# Patient Record
Sex: Female | Born: 1950 | Race: White | Hispanic: No | Marital: Married | Smoking: Never smoker
Health system: Southern US, Community
[De-identification: ages and names within clinical notes are randomized; demographics above are authoritative.]

## PROBLEM LIST (undated history)

## (undated) DIAGNOSIS — K219 Gastro-esophageal reflux disease without esophagitis: Secondary | ICD-10-CM

## (undated) DIAGNOSIS — E0590 Thyrotoxicosis, unspecified without thyrotoxic crisis or storm: Secondary | ICD-10-CM

## (undated) DIAGNOSIS — I10 Essential (primary) hypertension: Secondary | ICD-10-CM

## (undated) DIAGNOSIS — K2270 Barrett's esophagus without dysplasia: Secondary | ICD-10-CM

## (undated) DIAGNOSIS — M1990 Unspecified osteoarthritis, unspecified site: Secondary | ICD-10-CM

## (undated) DIAGNOSIS — E119 Type 2 diabetes mellitus without complications: Secondary | ICD-10-CM

## (undated) DIAGNOSIS — E7800 Pure hypercholesterolemia, unspecified: Secondary | ICD-10-CM

## (undated) DIAGNOSIS — R112 Nausea with vomiting, unspecified: Secondary | ICD-10-CM

## (undated) DIAGNOSIS — G4730 Sleep apnea, unspecified: Secondary | ICD-10-CM

## (undated) DIAGNOSIS — Z9889 Other specified postprocedural states: Secondary | ICD-10-CM

## (undated) HISTORY — DX: Barrett's esophagus without dysplasia: K22.70

## (undated) HISTORY — DX: Pure hypercholesterolemia, unspecified: E78.00

## (undated) HISTORY — DX: Type 2 diabetes mellitus without complications: E11.9

## (undated) HISTORY — DX: Essential (primary) hypertension: I10

## (undated) HISTORY — PX: CHOLECYSTECTOMY: SHX55

## (undated) HISTORY — DX: Thyrotoxicosis, unspecified without thyrotoxic crisis or storm: E05.90

## (undated) HISTORY — PX: PARTIAL HYSTERECTOMY: SHX80

## (undated) HISTORY — PX: CATARACT EXTRACTION: SUR2

---

## 2017-03-15 ENCOUNTER — Encounter (INDEPENDENT_AMBULATORY_CARE_PROVIDER_SITE_OTHER): Payer: Self-pay | Admitting: Internal Medicine

## 2017-03-15 ENCOUNTER — Encounter (INDEPENDENT_AMBULATORY_CARE_PROVIDER_SITE_OTHER): Payer: Self-pay

## 2017-03-29 ENCOUNTER — Encounter (INDEPENDENT_AMBULATORY_CARE_PROVIDER_SITE_OTHER): Payer: Self-pay | Admitting: *Deleted

## 2017-03-29 ENCOUNTER — Encounter (INDEPENDENT_AMBULATORY_CARE_PROVIDER_SITE_OTHER): Payer: Self-pay | Admitting: Internal Medicine

## 2017-03-29 ENCOUNTER — Ambulatory Visit (INDEPENDENT_AMBULATORY_CARE_PROVIDER_SITE_OTHER): Payer: Medicare Other | Admitting: Internal Medicine

## 2017-03-29 ENCOUNTER — Other Ambulatory Visit (INDEPENDENT_AMBULATORY_CARE_PROVIDER_SITE_OTHER): Payer: Self-pay | Admitting: Internal Medicine

## 2017-03-29 VITALS — BP 126/90 | HR 60 | Temp 98.3°F | Ht 63.0 in | Wt 166.6 lb

## 2017-03-29 DIAGNOSIS — I10 Essential (primary) hypertension: Secondary | ICD-10-CM | POA: Diagnosis not present

## 2017-03-29 DIAGNOSIS — K2270 Barrett's esophagus without dysplasia: Secondary | ICD-10-CM

## 2017-03-29 DIAGNOSIS — E0590 Thyrotoxicosis, unspecified without thyrotoxic crisis or storm: Secondary | ICD-10-CM

## 2017-03-29 DIAGNOSIS — E7800 Pure hypercholesterolemia, unspecified: Secondary | ICD-10-CM | POA: Diagnosis not present

## 2017-03-29 DIAGNOSIS — E119 Type 2 diabetes mellitus without complications: Secondary | ICD-10-CM

## 2017-03-29 HISTORY — DX: Type 2 diabetes mellitus without complications: E11.9

## 2017-03-29 HISTORY — DX: Thyrotoxicosis, unspecified without thyrotoxic crisis or storm: E05.90

## 2017-03-29 HISTORY — DX: Pure hypercholesterolemia, unspecified: E78.00

## 2017-03-29 HISTORY — DX: Barrett's esophagus without dysplasia: K22.70

## 2017-03-29 HISTORY — DX: Essential (primary) hypertension: I10

## 2017-03-29 NOTE — Patient Instructions (Addendum)
EGD in December.  The risks of bleeding, perforation and infection were reviewed with patient.

## 2017-03-29 NOTE — Progress Notes (Signed)
   Subjective:    Patient ID: Renee Acevedo, female    DOB: 03/04/1951, 66 y.o.   MRN: [id number]  HPI  Referred by Blima DessertJo Ann Earp FNP  for Barrett's esophagus. Diagnosed with 2011. Appetite is good. No weight loss. Acid reflux controlled with Omeprazole. Has a BM daily.   Hx of throat cancer father (father was an alcoholic).  09/08/2010 EGD: Dr. Aleene DavidsonSpainhour. Small hiatus hernia with distal erosions and questionable  Barrett's. Biopsy: Barrett's esophagus.    03/10/2012 Colonoscopy: External hemorrhoids (screening) F/u 10 yrs.  Review of Systems Past Medical History:  Diagnosis Date  . Barrett's esophagus 03/29/2017  . Diabetes (HCC) 03/29/2017  . Essential hypertension, benign 03/29/2017  . High cholesterol 03/29/2017  . High cholesterol 03/29/2017  . Hyperthyroidism 03/29/2017  . Hyperthyroidism 03/29/2017    No past surgical history on file.  Allergies  Allergen Reactions  . Avelox [Moxifloxacin Hcl In Nacl]     Burning her skin. blisters    No current outpatient prescriptions on file prior to visit.   No current facility-administered medications on file prior to visit.    Current Outpatient Prescriptions  Medication Sig Dispense Refill  . alendronate (FOSAMAX) 70 MG tablet Take 70 mg by mouth once a week. Take with a full glass of water on an empty stomach.    . Ascorbic Acid (VITAMIN C) 500 MG CAPS Take by mouth.    Marland Kitchen. aspirin EC 81 MG tablet Take 81 mg by mouth daily.    Marland Kitchen. atorvastatin (LIPITOR) 20 MG tablet Take 20 mg by mouth daily.    . calcium-vitamin D (OSCAL WITH D) 500-200 MG-UNIT tablet Take 1 tablet by mouth.    . Cholecalciferol (VITAMIN D3) 2000 units TABS Take 5,000 Units by mouth.     . Cinnamon 500 MG TABS Take by mouth.    . doxycycline (VIBRAMYCIN) 50 MG capsule Take 50 mg by mouth daily.     Marland Kitchen. levothyroxine (SYNTHROID, LEVOTHROID) 50 MCG tablet Take 50 mcg by mouth daily before breakfast. Take 50mcg one day and 75mcg the next day.    . lisinopril  (PRINIVIL,ZESTRIL) 20 MG tablet Take 20 mg by mouth daily.    . magnesium 30 MG tablet Take 250 mg by mouth daily.    . Omega-3 Fatty Acids (FISH OIL) 1000 MG CPDR Take 1,600 mg by mouth. Takes 3 tabs a night    . omeprazole (PRILOSEC) 20 MG capsule Take 20 mg by mouth daily.     No current facility-administered medications for this visit.          Objective:   Physical Exam Blood pressure 126/90, pulse 60, temperature 98.3 F (36.8 C), height 5\' 3"  (1.6 m), weight 166 lb 9.6 oz (75.6 kg). Alert and oriented. Skin warm and dry. Oral mucosa is moist.   . Sclera anicteric, conjunctivae is pink. Thyroid not enlarged. No cervical lymphadenopathy. Lungs clear. Heart regular rate and rhythm.  Abdomen is soft. Bowel sounds are positive. No hepatomegaly. No abdominal masses felt. No tenderness.  No edema to lower extremities.           Assessment & Plan:  Barrett's esophagus. I discussed with Dr. Karilyn Cotaehman. EGD. She will also need EGD every 5 yrs.

## 2017-04-01 ENCOUNTER — Encounter (INDEPENDENT_AMBULATORY_CARE_PROVIDER_SITE_OTHER): Payer: Self-pay

## 2017-07-13 ENCOUNTER — Encounter (HOSPITAL_COMMUNITY)
Admission: RE | Disposition: A | Discharge status: Home / Self Care | Payer: Self-pay | Source: Ambulatory Visit | Attending: Internal Medicine

## 2017-07-13 ENCOUNTER — Ambulatory Visit (HOSPITAL_COMMUNITY)
Admission: RE | Admit: 2017-07-13 | Discharge: 2017-07-13 | Disposition: A | Discharge status: Home / Self Care | Payer: Medicare Other | Source: Ambulatory Visit | Attending: Internal Medicine | Admitting: Internal Medicine

## 2017-07-13 ENCOUNTER — Encounter (HOSPITAL_COMMUNITY): Payer: Self-pay

## 2017-07-13 DIAGNOSIS — Z7982 Long term (current) use of aspirin: Secondary | ICD-10-CM | POA: Insufficient documentation

## 2017-07-13 DIAGNOSIS — I10 Essential (primary) hypertension: Secondary | ICD-10-CM | POA: Diagnosis not present

## 2017-07-13 DIAGNOSIS — E119 Type 2 diabetes mellitus without complications: Secondary | ICD-10-CM | POA: Diagnosis not present

## 2017-07-13 DIAGNOSIS — E7800 Pure hypercholesterolemia, unspecified: Secondary | ICD-10-CM | POA: Insufficient documentation

## 2017-07-13 DIAGNOSIS — K2270 Barrett's esophagus without dysplasia: Secondary | ICD-10-CM | POA: Insufficient documentation

## 2017-07-13 DIAGNOSIS — E0590 Thyrotoxicosis, unspecified without thyrotoxic crisis or storm: Secondary | ICD-10-CM | POA: Insufficient documentation

## 2017-07-13 DIAGNOSIS — Z79899 Other long term (current) drug therapy: Secondary | ICD-10-CM | POA: Diagnosis not present

## 2017-07-13 DIAGNOSIS — K317 Polyp of stomach and duodenum: Secondary | ICD-10-CM | POA: Insufficient documentation

## 2017-07-13 DIAGNOSIS — Z888 Allergy status to other drugs, medicaments and biological substances status: Secondary | ICD-10-CM | POA: Diagnosis not present

## 2017-07-13 DIAGNOSIS — K219 Gastro-esophageal reflux disease without esophagitis: Secondary | ICD-10-CM | POA: Diagnosis not present

## 2017-07-13 DIAGNOSIS — Z885 Allergy status to narcotic agent status: Secondary | ICD-10-CM | POA: Diagnosis not present

## 2017-07-13 DIAGNOSIS — K449 Diaphragmatic hernia without obstruction or gangrene: Secondary | ICD-10-CM | POA: Insufficient documentation

## 2017-07-13 DIAGNOSIS — K228 Other specified diseases of esophagus: Secondary | ICD-10-CM

## 2017-07-13 HISTORY — DX: Other specified postprocedural states: Z98.890

## 2017-07-13 HISTORY — PX: BIOPSY: SHX5522

## 2017-07-13 HISTORY — PX: ESOPHAGOGASTRODUODENOSCOPY: SHX5428

## 2017-07-13 HISTORY — DX: Nausea with vomiting, unspecified: R11.2

## 2017-07-13 LAB — GLUCOSE, CAPILLARY: Glucose-Capillary: 129 mg/dL — ABNORMAL HIGH (ref 65–99)

## 2017-07-13 SURGERY — EGD (ESOPHAGOGASTRODUODENOSCOPY)
Anesthesia: Moderate Sedation

## 2017-07-13 MED ORDER — LIDOCAINE VISCOUS 2 % MT SOLN
OROMUCOSAL | Status: AC
  Filled 2017-07-13: qty 15

## 2017-07-13 MED ORDER — STERILE WATER FOR IRRIGATION IR SOLN
Status: DC | PRN
  Administered 2017-07-13: 10:00:00

## 2017-07-13 MED ORDER — MEPERIDINE HCL 50 MG/ML IJ SOLN
INTRAMUSCULAR | Status: AC
  Filled 2017-07-13: qty 1

## 2017-07-13 MED ORDER — MIDAZOLAM HCL 5 MG/5ML IJ SOLN
INTRAMUSCULAR | Status: DC | PRN
  Administered 2017-07-13 (×2): 2 mg via INTRAVENOUS

## 2017-07-13 MED ORDER — SODIUM CHLORIDE 0.9 % IV SOLN
INTRAVENOUS | Status: DC
  Administered 2017-07-13: 1000 mL via INTRAVENOUS

## 2017-07-13 MED ORDER — LIDOCAINE VISCOUS 2 % MT SOLN
OROMUCOSAL | Status: DC | PRN
  Administered 2017-07-13: 4 mL via OROMUCOSAL

## 2017-07-13 MED ORDER — MIDAZOLAM HCL 5 MG/5ML IJ SOLN
INTRAMUSCULAR | Status: AC
  Filled 2017-07-13: qty 10

## 2017-07-13 MED ORDER — MEPERIDINE HCL 50 MG/ML IJ SOLN
INTRAMUSCULAR | Status: DC | PRN
  Administered 2017-07-13 (×2): 25 mg via INTRAVENOUS

## 2017-07-13 NOTE — Discharge Instructions (Signed)
Resume aspirin on 07/14/2017. Resume other medications and diet as before. No driving for 24 hours. Physician will call with biopsy results.     Esophagogastroduodenoscopy, Care After Refer to this sheet in the next few weeks. These instructions provide you with information about caring for yourself after your procedure. Your health care provider may also give you more specific instructions. Your treatment has been planned according to current medical practices, but problems sometimes occur. Call your health care provider if you have any problems or questions after your procedure. What can I expect after the procedure? After the procedure, it is common to have:  A sore throat.  Nausea.  Bloating.  Dizziness.  Fatigue.  Follow these instructions at home:  Do not eat or drink anything until the numbing medicine (local anesthetic) has worn off and your gag reflex has returned. You will know that the local anesthetic has worn off when you can swallow comfortably.  Do not drive for 24 hours if you received a medicine to help you relax (sedative).  If your health care provider took a tissue sample for testing during the procedure, make sure to get your test results. This is your responsibility. Ask your health care provider or the department performing the test when your results will be ready.  Keep all follow-up visits as told by your health care provider. This is important. Contact a health care provider if:  You cannot stop coughing.  You are not urinating.  You are urinating less than usual. Get help right away if:  You have trouble swallowing.  You cannot eat or drink.  You have throat or chest pain that gets worse.  You are dizzy or light-headed.  You faint.  You have nausea or vomiting.  You have chills.  You have a fever.  You have severe abdominal pain.  You have black, tarry, or bloody stools. This information is not intended to replace advice given to  you by your health care provider. Make sure you discuss any questions you have with your health care provider. Document Released: 08/16/2012 Document Revised: 02/05/2016 Document Reviewed: 07/24/2015 Elsevier Interactive Patient Education  2018 [employer]     Hiatal Hernia A hiatal hernia occurs when part of the stomach slides above the muscle that separates the abdomen from the chest (diaphragm). A person can be born with a hiatal hernia (congenital), or it may develop over time. In almost all cases of hiatal hernia, only the top part of the stomach pushes through the diaphragm. Many people have a hiatal hernia with no symptoms. The larger the hernia, the more likely it is that you will have symptoms. In some cases, a hiatal hernia allows stomach acid to flow back into the tube that carries food from your mouth to your stomach (esophagus). This may cause heartburn symptoms. Severe heartburn symptoms may mean that you have developed a condition called gastroesophageal reflux disease (GERD). What are the causes? This condition is caused by a weakness in the opening (hiatus) where the esophagus passes through the diaphragm to attach to the upper part of the stomach. A person may be born with a weakness in the hiatus, or a weakness can develop over time. What increases the risk? This condition is more likely to develop in:  Older people. Age is a major risk factor for a hiatal hernia, especially if you are over the age of 40.  Pregnant women.  People who are overweight.  People who have frequent constipation.  What are  the signs or symptoms? Symptoms of this condition usually develop in the form of GERD symptoms. Symptoms include:  Heartburn.  Belching.  Indigestion.  Trouble swallowing.  Coughing or wheezing.  Sore throat.  Hoarseness.  Chest pain.  Nausea and vomiting.  How is this diagnosed? This condition may be diagnosed during testing for GERD. Tests that may be  done include:  X-rays of your stomach or chest.  An upper gastrointestinal (GI) series. This is an X-ray exam of your GI tract that is taken after you swallow a chalky liquid that shows up clearly on the X-ray.  Endoscopy. This is a procedure to look into your stomach using a thin, flexible tube that has a tiny camera and light on the end of it.  How is this treated? This condition may be treated by:  Dietary and lifestyle changes to help reduce GERD symptoms.  Medicines. These may include: ? Over-the-counter antacids. ? Medicines that make your stomach empty more quickly. ? Medicines that block the production of stomach acid (H2 blockers). ? Stronger medicines to reduce stomach acid (proton pump inhibitors).  Surgery to repair the hernia, if other treatments are not helping.  If you have no symptoms, you may not need treatment. Follow these instructions at home: Lifestyle and activity  Do not use any products that contain nicotine or tobacco, such as cigarettes and e-cigarettes. If you need help quitting, ask your health care provider.  Try to achieve and maintain a healthy body weight.  Avoid putting pressure on your abdomen. Anything that puts pressure on your abdomen increases the amount of acid that may be pushed up into your esophagus. ? Avoid bending over, especially after eating. ? Raise the head of your bed by putting blocks under the legs. This keeps your head and esophagus higher than your stomach. ? Do not wear tight clothing around your chest or stomach. ? Try not to strain when having a bowel movement, when urinating, or when lifting heavy objects. Eating and drinking  Avoid foods that can worsen GERD symptoms. These may include: ? Fatty foods, like fried foods. ? Citrus fruits, like oranges or lemon. ? Other foods and drinks that contain acid, like orange juice or tomatoes. ? Spicy food. ? Chocolate.  Eat frequent small meals instead of three large meals a  day. This helps prevent your stomach from getting too full. ? Eat slowly. ? Do not lie down right after eating. ? Do not eat 1-2 hours before bed.  Do not drink beverages with caffeine. These include cola, coffee, cocoa, and tea.  Do not drink alcohol. General instructions  Take over-the-counter and prescription medicines only as told by your health care provider.  Keep all follow-up visits as told by your health care provider. This is important. Contact a health care provider if:  Your symptoms are not controlled with medicines or lifestyle changes.  You are having trouble swallowing.  You have coughing or wheezing that will not go away. Get help right away if:  Your pain is getting worse.  Your pain spreads to your arms, neck, jaw, teeth, or back.  You have shortness of breath.  You sweat for no reason.  You feel sick to your stomach (nauseous) or you vomit.  You vomit blood.  You have bright red blood in your stools.  You have black, tarry stools. This information is not intended to replace advice given to you by your health care provider. Make sure you discuss any questions you have  with your health care provider. Document Released: 11/20/2003 Document Revised: 08/23/2016 Document Reviewed: 08/23/2016 Elsevier Interactive Patient Education  [employer].

## 2017-07-13 NOTE — Op Note (Signed)
[facility] Patient Name: Renee Acevedo Procedure Date: 07/13/2017 9:20 AM MRN: [id number] Date of Birth: Oct 16, 1950 Attending MD: Lionel December , MD CSN: [id number] Age: 66 Admit Type: Outpatient Procedure:                Upper GI endoscopy Indications:              Barrett's esophagus, Follow-up of Barrett's                            esophagus Providers:                Lionel December, MD, Loma Messing B. Patsy Lager, RN, Edrick Kins, RN Referring MD:             Rande Brunt. Pomposini, MD Medicines:                Lidocaine spray, Meperidine 50 mg IV, Midazolam 4                            mg IV Complications:            No immediate complications. Estimated Blood Loss:     Estimated blood loss was minimal. Procedure:                Pre-Anesthesia Assessment:                           - Prior to the procedure, a History and Physical                            was performed, and patient medications and                            allergies were reviewed. The patient's tolerance of                            previous anesthesia was also reviewed. The risks                            and benefits of the procedure and the sedation                            options and risks were discussed with the patient.                            All questions were answered, and informed consent                            was obtained. Prior Anticoagulants: The patient                            last took aspirin 5 days prior to the procedure.  ASA Grade Assessment: II - A patient with mild                            systemic disease. After reviewing the risks and                            benefits, the patient was deemed in satisfactory                            condition to undergo the procedure.                           After obtaining informed consent, the endoscope was                            passed under direct vision. Throughout the                         procedure, the patient's blood pressure, pulse, and                            oxygen saturations were monitored continuously. The                            [id number]-[id number] ([id number]) scope was introduced through the                            mouth, and advanced to the second part of duodenum.                            The upper GI endoscopy was accomplished without                            difficulty. The patient tolerated the procedure                            well. Scope In: 9:39:13 AM Scope Out: 9:50:18 AM Total Procedure Duration: 0 hours 11 minutes 5 seconds  Findings:      The proximal esophagus and mid esophagus were normal.      There were esophageal mucosal changes consistent with short-segment       Barrett's esophagus present in the distal esophagus. The maximum       longitudinal extent of these mucosal changes was 0.8 cm in length.       Mucosa was biopsied with a cold forceps for histology randomly proximal       to the GE junction.      The Z-line was irregular and was found 34 cm from the incisors.      A 2 cm hiatal hernia was present.      Two small sessile polyps were found in the gastric fundus.      The exam of the stomach was otherwise normal.      The duodenal bulb and second portion of the duodenum were normal. Impression:               - Normal proximal esophagus  and mid esophagus.                           - Esophageal mucosal changes consistent with                            short-segment Barrett's esophagus. Single small                            patch. Biopsied.                           - Z-line irregular, 34 cm from the incisors.                           - 2 cm hiatal hernia.                           - Two gastric polyps.                           - Normal duodenal bulb and second portion of the                            duodenum. Moderate Sedation:      Moderate (conscious) sedation was administered by the endoscopy nurse        and supervised by the endoscopist. The following parameters were       monitored: oxygen saturation, heart rate, blood pressure, CO2       capnography and response to care. Total physician intraservice time was       16 minutes. Recommendation:           - Patient has a contact number available for                            emergencies. The signs and symptoms of potential                            delayed complications were discussed with the                            patient. Return to normal activities tomorrow.                            Written discharge instructions were provided to the                            patient.                           - Resume previous diet today.                           - Continue present medications.                           - Resume aspirin at prior dose tomorrow.                           -  Await pathology results.                           - Repeat upper endoscopy in 5 years for                            surveillance. Procedure Code(s):        --- Professional ---                           [phone], Esophagogastroduodenoscopy, flexible,                            transoral; with biopsy, single or multiple                           99152, Moderate sedation services provided by the                            same physician or other qualified health care                            professional performing the diagnostic or                            therapeutic service that the sedation supports,                            requiring the presence of an independent trained                            observer to assist in the monitoring of the                            patient's level of consciousness and physiological                            status; initial 15 minutes of intraservice time,                            patient age 23 years or older Diagnosis Code(s):        --- Professional ---                           K22.70, Barrett's esophagus  without dysplasia                           K22.8, Other specified diseases of esophagus                           K44.9, Diaphragmatic hernia without obstruction or                            gangrene  K31.7, Polyp of stomach and duodenum CPT copyright 2016 American Medical Association. All rights reserved. The codes documented in this report are preliminary and upon coder review may  be revised to meet current compliance requirements. Lionel DecemberNajeeb Parks Czajkowski, MD Lionel DecemberNajeeb Jibran Crookshanks, MD 07/13/2017 10:03:52 AM This report has been signed electronically. Number of Addenda: 0

## 2017-07-13 NOTE — H&P (Addendum)
Renee Acevedo is an 66 y.o. female.   Chief Complaint: Patient is here for EGD. HPI: Patient is 66 year old Caucasian female who was chronic GERD complicated by Barrett's esophagus who is here for surveillance EGD.  She denies dysphagia nausea vomiting epigastric pain or melena.  She feels heartburn is well controlled with medication and dietary measures.  Last EGD with biopsy was 3 years ago.  Past Medical History:  Diagnosis Date  . Barrett's esophagus 03/29/2017  . Diabetes (HCC) 03/29/2017  . Essential hypertension, benign 03/29/2017  . High cholesterol 03/29/2017   Rosacea 03/29/2017    03/29/2017  . Hyperthyroidism 03/29/2017  . PONV (postoperative nausea and vomiting)     Past Surgical History:  Procedure Laterality Date  . CATARACT EXTRACTION     both eyes  . CHOLECYSTECTOMY    . PARTIAL HYSTERECTOMY      History reviewed. No pertinent family history. Social History:  reports that she has never smoked. She has never used smokeless tobacco. She reports that she does not drink alcohol or use drugs.  Allergies:  Allergies  Allergen Reactions  . Avelox [Moxifloxacin Hcl In Nacl]     Burning her skin. blisters  . Codeine Nausea And Vomiting    Cough syrup     Medications Prior to Admission  Medication Sig Dispense Refill  . acetaminophen (TYLENOL) 500 MG tablet Take 1,000 mg by mouth daily as needed for moderate pain or headache.    Marland Kitchen aspirin EC 81 MG tablet Take 81 mg by mouth every other day.     Marland Kitchen atorvastatin (LIPITOR) 40 MG tablet Take 40 mg by mouth daily.    . Azelaic Acid (FINACEA) 15 % cream Apply 1 application topically 2 (two) times daily. To face    . calcium-vitamin D (OSCAL WITH D) 500-200 MG-UNIT tablet Take 1 tablet by mouth every other day.     . Cholecalciferol (VITAMIN D3) 5000 units CAPS Take 5,000 Units by mouth daily.    Marland Kitchen CINNAMON PO Take 1,000 mg by mouth daily.    Marland Kitchen doxycycline (VIBRAMYCIN) 50 MG capsule Take 50 mg by mouth daily.     Marland Kitchen  levothyroxine (SYNTHROID, LEVOTHROID) 50 MCG tablet Take 50 mcg by mouth every other day. Alternate with every other day    . levothyroxine (SYNTHROID, LEVOTHROID) 75 MCG tablet Take 75 mcg by mouth every other day. Alternate with every other day    . lisinopril (PRINIVIL,ZESTRIL) 20 MG tablet Take 20 mg by mouth daily.    . Magnesium 250 MG TABS Take 250 mg by mouth daily.    . Omega-3-6-9 CAPS Take 3 capsules by mouth daily.    Marland Kitchen omeprazole (PRILOSEC) 20 MG capsule Take 20 mg by mouth daily.    Marland Kitchen Propylene Glycol (SYSTANE BALANCE OP) Apply 1 drop to eye daily as needed (dry eyes).    . vitamin C (ASCORBIC ACID) 500 MG tablet Take 500 mg by mouth daily.      Results for orders placed or performed during the hospital encounter of 07/13/17 (from the past 48 hour(s))  Glucose, capillary     Status: Abnormal   Collection Time: 07/13/17  9:08 AM  Result Value Ref Range   Glucose-Capillary 129 (H) 65 - 99 mg/dL   No results found.  ROS  Blood pressure (!) 126/59, pulse 64, temperature 98 F (36.7 C), temperature source Oral, resp. rate 14, height 5' 1.5" (1.562 m), weight 163 lb (73.9 kg), SpO2 100 %. Physical Exam  Constitutional: She appears well-developed and well-nourished.  HENT:  Mouth/Throat: Oropharynx is clear and moist.  Eyes: Conjunctivae are normal. No scleral icterus.  Neck: No thyromegaly present.  Cardiovascular: Normal rate, regular rhythm and normal heart sounds.   No murmur heard. Respiratory: Effort normal and breath sounds normal.  GI: Soft. She exhibits no distension and no mass. There is no tenderness.  Musculoskeletal: She exhibits no edema.  Lymphadenopathy:    She has no cervical adenopathy.  Neurological: She is alert.  Skin: Skin is warm and dry.     Assessment/Plan Chronic GERD complicated by Barrett's esophagus.. Surveillance EGD.  Lionel DecemberNajeeb Laasia Arcos, MD 07/13/2017, 9:30 AM

## 2017-07-18 ENCOUNTER — Encounter (HOSPITAL_COMMUNITY): Payer: Self-pay | Admitting: Internal Medicine

## 2021-02-03 ENCOUNTER — Encounter (INDEPENDENT_AMBULATORY_CARE_PROVIDER_SITE_OTHER): Payer: Self-pay | Admitting: *Deleted

## 2021-02-04 ENCOUNTER — Telehealth (INDEPENDENT_AMBULATORY_CARE_PROVIDER_SITE_OTHER): Payer: Medicare Other | Admitting: Gastroenterology

## 2021-02-04 ENCOUNTER — Encounter (INDEPENDENT_AMBULATORY_CARE_PROVIDER_SITE_OTHER): Payer: Self-pay | Admitting: Gastroenterology

## 2021-02-04 ENCOUNTER — Other Ambulatory Visit: Payer: Self-pay

## 2021-02-04 DIAGNOSIS — R633 Feeding difficulties, unspecified: Secondary | ICD-10-CM

## 2021-02-04 DIAGNOSIS — R1312 Dysphagia, oropharyngeal phase: Secondary | ICD-10-CM | POA: Diagnosis not present

## 2021-02-04 DIAGNOSIS — T17308A Unspecified foreign body in larynx causing other injury, initial encounter: Secondary | ICD-10-CM | POA: Insufficient documentation

## 2021-02-04 DIAGNOSIS — R1310 Dysphagia, unspecified: Secondary | ICD-10-CM | POA: Insufficient documentation

## 2021-02-04 NOTE — Progress Notes (Signed)
Katrinka Blazing, M.D. Gastroenterology & Hepatology [facility] [address] [place], [place] [address] Primary Care Physician: Pomposini, Rande Brunt, MD No address on file  Referring MD: PCP  This is a telephone virtual visit.  It required patient-provider interaction for the medical decision making as documented below. The patient has consented and agreed to proceed with a Telehealth encounter given the current Coronavirus pandemic.  VIRTUAL VISIT NOTE Patient location: Home Provider location: Home  I will communicate my assessment and recommendations to the referring MD via EMR.  Chief Complaint: Choking  History of Present Illness: Renee Acevedo is a 70 y.o. 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  The patient reports that sometimes when she starts drinking liquids she presents some choking episodes.  She states that these symptoms started 3-5 months ago.  She denies having any dysphagia to solids and states that she has been able to eat as usual otherwise.  Denies having any regurgitation of food after eating.  States that prior to this she did not have any problems with liquids in the past. The patient denies having any odynophagia, heartburn, nausea, vomiting, fever, chills, hematochezia, melena, hematemesis, abdominal distention, abdominal pain, diarrhea, jaundice, pruritus. Lost 30 lb after implementing [employer] changes.  Patient takes omeprazole 20 mg at nighttime for GERD and Barrett's esophagus.  She was advised to have a repeat EGD 5 years after her most recent examination for surveillance of Barrett's esophagus.  Last EGD: 2018 - Normal proximal esophagus and mid esophagus. - Esophageal mucosal changes consistent with short-segment Barrett's esophagus. Single small patch. 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 - 2 cm hiatal hernia. - Two gastric polyps. - Normal duodenal bulb and second portion of the duodenum. Recommended to repeat EGD in 5 years.  Last Colonoscopy:2013 - normal per patient, no report available  FHx: neg for any gastrointestinal/liver disease, mother kidney cancer, father throat cancer Social: neg smoking, alcohol or illicit drug use Surgical: cholecystectomy  Past Medical History: Past Medical History:  Diagnosis Date  . Barrett's esophagus 03/29/2017  . Diabetes (HCC) 03/29/2017  . Essential hypertension, benign 03/29/2017  . High cholesterol 03/29/2017  . High cholesterol 03/29/2017  . Hyperthyroidism 03/29/2017  . Hyperthyroidism 03/29/2017  . PONV (postoperative nausea and vomiting)     Past Surgical History: Past Surgical History:  Procedure Laterality Date  . BIOPSY  07/13/2017   Procedure: BIOPSY;  Surgeon: Malissa Hippo, MD;  [facility];  Service: Endoscopy;;  esophagus  . CATARACT EXTRACTION     both eyes  . CHOLECYSTECTOMY    . ESOPHAGOGASTRODUODENOSCOPY N/A 07/13/2017   Procedure: ESOPHAGOGASTRODUODENOSCOPY (EGD);  Surgeon: Malissa Hippo, MD;  [facility];  Service: Endoscopy;  Laterality: N/A;  12:00  . PARTIAL HYSTERECTOMY      Family History:History reviewed. No pertinent family history.  Social History: Social History   Tobacco Use  Smoking Status Never Smoker  Smokeless Tobacco Never Used   Social History   Substance and Sexual Activity  Alcohol Use No   Social History   Substance and Sexual Activity  Drug Use No    Allergies: Allergies  Allergen Reactions  . Avelox [Moxifloxacin Hcl In Nacl]     Burning her skin. blisters  . Codeine Nausea And Vomiting    Cough syrup     Medications: Current Outpatient Medications  Medication Sig Dispense Refill  . acetaminophen (TYLENOL) 500 MG tablet Take 1,000 mg by mouth daily as needed for moderate pain or headache.    Marland Kitchen aspirin EC 81 MG tablet  Take 1 tablet (81 mg total) by mouth every other day.    Marland Kitchen atorvastatin (LIPITOR) 40 MG tablet Take 40 mg by mouth daily.    . Azelaic Acid 15 % gel Apply 1 application topically 2 (two) times daily. To face    . calcium-vitamin D (OSCAL WITH D) 500-200 MG-UNIT tablet Take 1 tablet by mouth every other day.     . Cholecalciferol (VITAMIN D3) 5000 units CAPS Take 5,000 Units by mouth daily.    Marland Kitchen CINNAMON PO Take 1,000 mg by mouth daily.    Marland Kitchen doxycycline (VIBRAMYCIN) 50 MG capsule Take 50 mg by mouth daily.     Marland Kitchen ezetimibe (ZETIA) 10 MG tablet Take 10 mg by mouth daily.    Marland Kitchen levothyroxine (SYNTHROID, LEVOTHROID) 50 MCG tablet Take 50 mcg by mouth every other day. Alternate with every other day    . levothyroxine (SYNTHROID, LEVOTHROID) 75 MCG tablet Take 75 mcg by mouth every other day. Alternate with every other day    . lisinopril (PRINIVIL,ZESTRIL) 20 MG tablet Take 20 mg by mouth daily. 10 mg per day per patient.    . Magnesium 250 MG TABS Take 250 mg by mouth daily.    . Omega-3-6-9 CAPS Take 3 capsules by mouth daily.    Marland Kitchen omeprazole (PRILOSEC) 20 MG capsule Take 20 mg by mouth daily.    Marland Kitchen Propylene Glycol (SYSTANE BALANCE OP) Apply 1 drop to eye daily as needed (dry eyes).    . vitamin C (ASCORBIC ACID) 500 MG tablet Take 500 mg by mouth daily.     No current facility-administered medications for this visit.    Review of Systems: GENERAL: negative for malaise, night sweats HEENT: No changes in hearing or vision, no nose bleeds or other nasal problems. NECK: Negative for lumps, goiter, pain and significant neck swelling RESPIRATORY: Negative for cough, wheezing CARDIOVASCULAR: Negative for chest pain, leg swelling, palpitations, orthopnea GI: SEE HPI MUSCULOSKELETAL: Negative for joint pain or swelling, back pain, and muscle pain. SKIN: Negative for lesions, rash PSYCH: Negative for sleep disturbance, mood disorder and recent psychosocial stressors. HEMATOLOGY Negative  for prolonged bleeding, bruising easily, and swollen nodes. ENDOCRINE: Negative for cold or heat intolerance, polyuria, polydipsia and goiter. NEURO: negative for tremor, gait imbalance, syncope and seizures. The remainder of the review of systems is noncontributory.   Physical Exam: No exam was performed as this was a telephone encounter  Imaging/Labs: as above  I personally reviewed and interpreted the available labs, imaging and endoscopic files.  Impression and Plan: Renee Acevedo is a 70 y.o. 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  The patient has presented symptoms only with intake of liquids.  I suspect these symptoms are due to an oropharyngeal dysfunction as she does not present any dysphagia or any other red flag signs, and she had a relatively recent endoscopic investigation which was unremarkable for any esophageal source.  Due to this, we will proceed with a modified barium swallow.  I explained to the patient that if these investigations are negative we may need to proceed with a repeat EGD.  The patient understood and agreed.  Schedule modified barium swallow  All questions were answered.      Total  visit time: I spent a total of  30 minutes  Katrinka Blazing, MD Gastroenterology and Hepatology [facility]

## 2021-02-04 NOTE — Patient Instructions (Signed)
Schedule modified barium swallow If negative findings in MBS, will consider EGD

## 2021-02-10 ENCOUNTER — Other Ambulatory Visit (HOSPITAL_COMMUNITY): Payer: Self-pay | Admitting: Specialist

## 2021-02-10 DIAGNOSIS — R1312 Dysphagia, oropharyngeal phase: Secondary | ICD-10-CM

## 2021-02-10 DIAGNOSIS — R633 Feeding difficulties, unspecified: Secondary | ICD-10-CM

## 2021-02-13 ENCOUNTER — Other Ambulatory Visit (HOSPITAL_COMMUNITY): Payer: Self-pay | Admitting: Specialist

## 2021-02-13 DIAGNOSIS — K2270 Barrett's esophagus without dysplasia: Secondary | ICD-10-CM

## 2021-02-23 ENCOUNTER — Ambulatory Visit (HOSPITAL_COMMUNITY)
Admission: RE | Admit: 2021-02-23 | Discharge: 2021-02-23 | Disposition: A | Discharge status: Home / Self Care | Payer: Medicare Other | Source: Ambulatory Visit | Attending: Gastroenterology | Admitting: Gastroenterology

## 2021-02-23 ENCOUNTER — Ambulatory Visit (HOSPITAL_COMMUNITY): Payer: Medicare Other | Attending: Gastroenterology | Admitting: Speech Pathology

## 2021-02-23 ENCOUNTER — Other Ambulatory Visit: Payer: Self-pay

## 2021-02-23 ENCOUNTER — Encounter (HOSPITAL_COMMUNITY): Payer: Self-pay | Admitting: Speech Pathology

## 2021-02-23 DIAGNOSIS — T17308A Unspecified foreign body in larynx causing other injury, initial encounter: Secondary | ICD-10-CM | POA: Diagnosis present

## 2021-02-23 DIAGNOSIS — R1312 Dysphagia, oropharyngeal phase: Secondary | ICD-10-CM | POA: Insufficient documentation

## 2021-02-23 DIAGNOSIS — R633 Feeding difficulties, unspecified: Secondary | ICD-10-CM

## 2021-02-23 NOTE — Therapy (Signed)
[facility] [address] [place], [place], [address] Phone: [phone]   Fax:  [phone]  Modified Barium Swallow  Patient Details  Name: Renee Acevedo MRN: [id number] Date of Birth: 1950/12/29 No data recorded  Encounter Date: 02/23/2021   End of Session - 02/23/21 1449     Visit Number 1    Number of Visits 1    Authorization Type Medicare    SLP Start Time 1145    SLP Stop Time  1220    SLP Time Calculation (min) 35 min    Activity Tolerance Patient tolerated treatment well             Past Medical History:  Diagnosis Date   Barrett's esophagus 03/29/2017   Diabetes (HCC) 03/29/2017   Essential hypertension, benign 03/29/2017   High cholesterol 03/29/2017   High cholesterol 03/29/2017   Hyperthyroidism 03/29/2017   Hyperthyroidism 03/29/2017   PONV (postoperative nausea and vomiting)     Past Surgical History:  Procedure Laterality Date   BIOPSY  07/13/2017   Procedure: BIOPSY;  Surgeon: Malissa Hippo, MD;  [facility];  Service: Endoscopy;;  esophagus   CATARACT EXTRACTION     both eyes   CHOLECYSTECTOMY     ESOPHAGOGASTRODUODENOSCOPY N/A 07/13/2017   Procedure: ESOPHAGOGASTRODUODENOSCOPY (EGD);  Surgeon: Malissa Hippo, MD;  [facility];  Service: Endoscopy;  Laterality: N/A;  12:00   PARTIAL HYSTERECTOMY      There were no vitals filed for this visit.   Subjective Assessment - 02/23/21 1432     Subjective "I usually cough on the first sip and usually only with water."    Special Tests MBSS    Currently in Pain? No/denies                 General - 02/23/21 1434       General Information   Date of Onset 02/04/21    HPI Renee Acevedo is a 70 y.o. 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 Pt was referred by Dr. Levon Hedger for MBSS. She states that these symptoms started 3-5 months ago.  She  denies having any dysphagia to solids and states that she has been able to eat as usual otherwise. Patient takes omeprazole 20 mg at nighttime for GERD and Barrett's esophagus.  She was advised to have a repeat EGD 5 years after her most recent examination for surveillance of Barrett's esophagus. Pt indicates that she primarily drinks her water from a water bottle.    Type of Study MBS-Modified Barium Swallow Study    Diet Prior to this Study Regular;Thin liquids    Temperature Spikes Noted No    Respiratory Status Room air    History of Recent Intubation No    Behavior/Cognition Alert;Cooperative;Pleasant mood    Oral Cavity Assessment Within Functional Limits    Oral Care Completed by SLP No    Oral Cavity - Dentition Adequate natural dentition    Vision Functional for self feeding    Self-Feeding Abilities Able to feed self    Patient Positioning Upright in chair    Baseline Vocal Quality Normal    Volitional Cough Strong    Volitional Swallow Able to elicit    Anatomy Within functional limits    Pharyngeal Secretions Not observed secondary MBS                Oral Preparation/Oral  Phase - 02/23/21 1438       Oral Preparation/Oral Phase   Oral Phase Within functional limits      Electrical stimulation - Oral Phase   Was Electrical Stimulation Used No              Pharyngeal Phase - 02/23/21 1438       Pharyngeal Phase   Pharyngeal Phase Within functional limits      Electrical Stimulation - Pharyngeal Phase   Was Electrical Stimulation Used No              Cricopharyngeal Phase - 02/23/21 1439       Cervical Esophageal Phase   Cervical Esophageal Phase Impaired      Cervical Esophageal Phase - Thin   Thin Cup Prominent cricopharyngeal segment      Cervical Esophageal Phase - Comment   Cervical Esophageal Comment Pt with prominent cricopharyngeus which was more apparent with thin liquids and 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 Oral motor examination is WNL. Pt assessed with barium tinged thin (tsp/cup/straw), puree, regular textures, and barium tablet with cup sip thin. Swallow initiation was timely and hyolaryngeal excursion WNL, no penetration/aspiration or significant pharyngeal residuals remained. Pt does exhibit with a prominent cricopharyngeus and small Zenker's diverticulum apparent with sips of thin liquids. The barium tablet was transiently delayed in the mid esophagus, but quickly passed to stomach with a bite of puree wash. Pt reports that she typically drinks her water from a bottle, which likely makes her tip her head back, increasing likelihood of premature spillage into the pharynx which may elicit a cough. Pt then agreed that this could be true. She was encouraged to pour her water into a cup or ok to use a straw, focus on keeping her head neutral to down. The imaging from the study and recommendations were reviewed with Pt. No further SLP services indicated at this time. Pt in agreement with plan of care.    Potential to Achieve Goals Good    Consulted and Agree with Plan of Care Patient             Patient will benefit from skilled therapeutic intervention in order to improve the following deficits and impairments:   Dysphagia, oropharyngeal phase     Recommendations/Treatment - 02/23/21 1442       Swallow Evaluation Recommendations   SLP Diet Recommendations Thin;Age appropriate regular    Liquid Administration via Cup;Other (Comment)   avoid tipping head back, avoid bottle with thins   Medication Administration Whole meds with liquid    Supervision Patient able to self feed    Compensations --   avoid drinking from water bottle, pour into a cup   Postural Changes Seated upright at 90 degrees;Remain upright for at least 30 minutes after feeds/meals               Prognosis - 02/23/21 1448       Prognosis   Prognosis for Safe Diet Advancement Good      Individuals Consulted   Consulted and Agree with Results and Recommendations Patient    Report Sent to  Referring physician             Problem List Patient Active Problem List   Diagnosis Date Noted   Dysphagia 02/04/2021  Choking 02/04/2021   Hyperthyroidism 03/29/2017   High cholesterol 03/29/2017   Essential hypertension, benign 03/29/2017   Diabetes (HCC) 03/29/2017   Barrett's esophagus 03/29/2017   Barrett's esophagus without dysplasia 03/29/2017   Thank you,  Havery Moros, CCC-SLP [phone]  Renee Acevedo 02/23/2021, 3:09 PM  [facility] [address] [place], [place], [address] Phone: [phone]   Fax:  [phone]  Name: Renee Acevedo MRN: [id number] Date of Birth: 1950/12/13

## 2021-02-24 ENCOUNTER — Other Ambulatory Visit (INDEPENDENT_AMBULATORY_CARE_PROVIDER_SITE_OTHER): Payer: Self-pay

## 2021-02-24 DIAGNOSIS — R1312 Dysphagia, oropharyngeal phase: Secondary | ICD-10-CM

## 2021-02-25 ENCOUNTER — Encounter (INDEPENDENT_AMBULATORY_CARE_PROVIDER_SITE_OTHER): Payer: Self-pay

## 2021-03-06 NOTE — Patient Instructions (Signed)
20    Your procedure is scheduled on: 03/13/2021  Report to [facility] Main entrance at    11:30 AM.  Call this number if you have problems the morning of surgery: [phone]   Remember:   Follow instructions on letter from office regarding when to stop eating and drinking        No Smoking the day of procedure      Take these medicines the morning of surgery with A SIP OF WATER: levothroxine and omperazole   Do not wear jewelry, make-up or nail polish.  Do not wear lotions, powders, or perfumes. You may wear deodorant.                Do not bring valuables to the hospital.  Contacts, dentures or bridgework may not be worn into surgery.  Leave suitcase in the car. After surgery it may be brought to your room.  For patients admitted to the hospital, checkout time is 11:00 AM the day of discharge.   Patients discharged the day of surgery will not be allowed to drive home. Upper Endoscopy, Adult Upper endoscopy is a procedure to look inside the upper GI (gastrointestinal) tract. The upper GI tract is made up of: The part of the body that moves food from your mouth to your stomach (esophagus). The stomach. The first part of your small intestine (duodenum). This procedure is also called esophagogastroduodenoscopy (EGD) or gastroscopy. In this procedure, your health care provider passes a thin, flexible tube (endoscope) through your mouth and down your esophagus into your stomach. A small camera is attached to the end of the tube. Images from the camera appear on a monitor in the exam room. During this procedure, your health care provider may also remove a small piece of tissue to be sent to a lab and examined under a microscope (biopsy). Your health care provider may do an upper endoscopy to diagnose cancers of the upper GI tract. You may also have this procedure to find the cause of other conditions, such as: Stomach pain. Heartburn. Pain or problems when swallowing. Nausea and  vomiting. Stomach bleeding. Stomach ulcers. Tell a health care provider about: Any allergies you have. All medicines you are taking, including vitamins, herbs, eye drops, creams, and over-the-counter medicines. Any problems you or family members have had with anesthetic medicines. Any blood disorders you have. Any surgeries you have had. Any medical conditions you have. Whether you are pregnant or may be pregnant. What are the risks? Generally, this is a safe procedure. However, problems may occur, including: Infection. Bleeding. Allergic reactions to medicines. A tear or hole (perforation) in the esophagus, stomach, or duodenum. What happens before the procedure? Staying hydrated .   Medicines Ask your health care provider about: Changing or stopping your regular medicines. This is especially important if you are taking diabetes medicines or blood thinners. Taking medicines such as aspirin and ibuprofen. These medicines can thin your blood. Do not take these medicines unless your health care provider tells you to take them. Taking over-the-counter medicines, vitamins, herbs, and supplements. General instructions Plan to have someone take you home from the hospital or clinic. If you will be going home right after the procedure, plan to have someone with you for 24 hours. Ask your health care provider what steps will be taken to help prevent infection. What happens during the procedure?  An IV will be inserted into one of your veins. You may be given one or more of the  following: A medicine to help you relax (sedative). A medicine to numb the throat (local anesthetic). You will lie on your left side on an exam table. Your health care provider will pass the endoscope through your mouth and down your esophagus. Your health care provider will use the scope to check the inside of your esophagus, stomach, and duodenum. Biopsies may be taken. The endoscope will be removed. The  procedure may vary among health care providers and hospitals. What happens after the procedure? Your blood pressure, heart rate, breathing rate, and blood oxygen level will be monitored until you leave the hospital or clinic. Do not drive for 24 hours if you were given a sedative during your procedure. When your throat is no longer numb, you may be given some fluids to drink. It is up to you to get the results of your procedure. Ask your health care provider, or the department that is doing the procedure, when your results will be ready. Summary Upper endoscopy is a procedure to look inside the upper GI tract. During the procedure, an IV will be inserted into one of your veins. You may be given a medicine to help you relax. A medicine will be used to numb your throat. The endoscope will be passed through your mouth and down your esophagus. This information is not intended to replace advice given to you by your health care provider. Make sure you discuss any questions you have with your health care provider. Document Revised: 02/22/2018 Document Reviewed: 01/30/2018 Elsevier Patient Education  2020 [employer]                                                                                                                                      EndoscopyCare After  Please read the instructions outlined below and refer to this sheet in the next few weeks. These discharge instructions provide you with general information on caring for yourself after you leave the hospital. Your doctor may also give you specific instructions. While your treatment has been planned according to the most current medical practices available, unavoidable complications occasionally occur. If you have any problems or questions after discharge, please call your doctor. HOME CARE INSTRUCTIONS Activity You may resume your regular activity but move at a slower pace for the next 24 hours.  Take frequent rest periods for the  next 24 hours.  Walking will help expel (get rid of) the air and reduce the bloated feeling in your abdomen.  No driving for 24 hours (because of the anesthesia (medicine) used during the test).  You may shower.  Do not sign any important legal documents or operate any machinery for 24 hours (because of the anesthesia used during the test).  Nutrition Drink plenty of fluids.  You may resume your normal diet.  Begin with a light meal and progress to your normal diet.  Avoid alcoholic beverages for 24 hours or as  instructed by your caregiver.  Medications You may resume your normal medications unless your caregiver tells you otherwise. What you can expect today You may experience abdominal discomfort such as a feeling of fullness or "gas" pains.  You may experience a sore throat for 2 to 3 days. This is normal. Gargling with salt water may help this.  Follow-up Your doctor will discuss the results of your test with you. SEEK IMMEDIATE MEDICAL CARE IF: You have excessive nausea (feeling sick to your stomach) and/or vomiting.  You have severe abdominal pain and distention (swelling).  You have trouble swallowing.  You have a temperature over 100 F (37.8 C).  You have rectal bleeding or vomiting of blood.  Document Released: 04/13/2004 Document Revised: 08/19/2011 Document Reviewed: 10/25/2007

## 2021-03-11 ENCOUNTER — Encounter (HOSPITAL_COMMUNITY)
Admission: RE | Admit: 2021-03-11 | Discharge: 2021-03-11 | Disposition: A | Discharge status: Home / Self Care | Payer: Medicare Other | Source: Ambulatory Visit | Attending: Gastroenterology | Admitting: Gastroenterology

## 2021-03-11 ENCOUNTER — Other Ambulatory Visit: Payer: Self-pay

## 2021-03-11 ENCOUNTER — Encounter (HOSPITAL_COMMUNITY): Payer: Self-pay

## 2021-03-11 DIAGNOSIS — Z01818 Encounter for other preprocedural examination: Secondary | ICD-10-CM | POA: Insufficient documentation

## 2021-03-11 HISTORY — DX: Gastro-esophageal reflux disease without esophagitis: K21.9

## 2021-03-11 HISTORY — DX: Sleep apnea, unspecified: G47.30

## 2021-03-11 HISTORY — DX: Unspecified osteoarthritis, unspecified site: M19.90

## 2021-03-11 LAB — BASIC METABOLIC PANEL
Anion gap: 6 (ref 5–15)
BUN: 15 mg/dL (ref 8–23)
CO2: 27 mmol/L (ref 22–32)
Calcium: 9.3 mg/dL (ref 8.9–10.3)
Chloride: 104 mmol/L (ref 98–111)
Creatinine, Ser: 0.8 mg/dL (ref 0.44–1.00)
GFR, Estimated: >60 mL/min (ref >60)
Glucose, Bld: 101 mg/dL — ABNORMAL HIGH (ref 70–99)
Potassium: 4 mmol/L (ref 3.5–5.1)
Sodium: 137 mmol/L (ref 135–145)

## 2021-03-13 ENCOUNTER — Encounter (HOSPITAL_COMMUNITY): Payer: Self-pay | Admitting: Gastroenterology

## 2021-03-13 ENCOUNTER — Ambulatory Visit (HOSPITAL_COMMUNITY): Payer: Medicare Other | Admitting: Anesthesiology

## 2021-03-13 ENCOUNTER — Ambulatory Visit (HOSPITAL_COMMUNITY)
Admission: RE | Admit: 2021-03-13 | Discharge: 2021-03-13 | Disposition: A | Discharge status: Home / Self Care | Payer: Medicare Other | Attending: Gastroenterology | Admitting: Gastroenterology

## 2021-03-13 ENCOUNTER — Other Ambulatory Visit: Payer: Self-pay

## 2021-03-13 ENCOUNTER — Encounter (HOSPITAL_COMMUNITY)
Admission: RE | Disposition: A | Discharge status: Home / Self Care | Payer: Self-pay | Source: Home / Self Care | Attending: Gastroenterology

## 2021-03-13 DIAGNOSIS — Z9049 Acquired absence of other specified parts of digestive tract: Secondary | ICD-10-CM | POA: Insufficient documentation

## 2021-03-13 DIAGNOSIS — R1310 Dysphagia, unspecified: Secondary | ICD-10-CM | POA: Diagnosis present

## 2021-03-13 DIAGNOSIS — Z79899 Other long term (current) drug therapy: Secondary | ICD-10-CM | POA: Insufficient documentation

## 2021-03-13 DIAGNOSIS — Z7982 Long term (current) use of aspirin: Secondary | ICD-10-CM | POA: Diagnosis not present

## 2021-03-13 DIAGNOSIS — R1312 Dysphagia, oropharyngeal phase: Secondary | ICD-10-CM

## 2021-03-13 DIAGNOSIS — Z90711 Acquired absence of uterus with remaining cervical stump: Secondary | ICD-10-CM | POA: Insufficient documentation

## 2021-03-13 DIAGNOSIS — Z885 Allergy status to narcotic agent status: Secondary | ICD-10-CM | POA: Diagnosis not present

## 2021-03-13 DIAGNOSIS — Z881 Allergy status to other antibiotic agents status: Secondary | ICD-10-CM | POA: Insufficient documentation

## 2021-03-13 DIAGNOSIS — K2270 Barrett's esophagus without dysplasia: Secondary | ICD-10-CM | POA: Insufficient documentation

## 2021-03-13 DIAGNOSIS — Z79890 Hormone replacement therapy (postmenopausal): Secondary | ICD-10-CM | POA: Insufficient documentation

## 2021-03-13 HISTORY — PX: ESOPHAGEAL DILATION: SHX303

## 2021-03-13 HISTORY — PX: BIOPSY: SHX5522

## 2021-03-13 HISTORY — PX: ESOPHAGOGASTRODUODENOSCOPY (EGD) WITH PROPOFOL: SHX5813

## 2021-03-13 LAB — GLUCOSE, CAPILLARY: Glucose-Capillary: 127 mg/dL — ABNORMAL HIGH (ref 70–99)

## 2021-03-13 SURGERY — ESOPHAGOGASTRODUODENOSCOPY (EGD) WITH PROPOFOL
Anesthesia: General

## 2021-03-13 MED ORDER — LIDOCAINE HCL (CARDIAC) PF 100 MG/5ML IV SOSY
PREFILLED_SYRINGE | INTRAVENOUS | Status: DC | PRN
  Administered 2021-03-13: 100 mg via INTRAVENOUS

## 2021-03-13 MED ORDER — PROPOFOL 500 MG/50ML IV EMUL
INTRAVENOUS | Status: DC | PRN
  Administered 2021-03-13: 100 mg via INTRAVENOUS
  Administered 2021-03-13: 125 ug/kg/min via INTRAVENOUS

## 2021-03-13 MED ORDER — LACTATED RINGERS IV SOLN: INTRAVENOUS | Status: DC

## 2021-03-13 NOTE — Transfer of Care (Signed)
Immediate Anesthesia Transfer of Care Note  Patient: Renee Acevedo  Procedure(s) Performed: ESOPHAGOGASTRODUODENOSCOPY (EGD) WITH PROPOFOL ESOPHAGEAL DILATION BIOPSY  Patient Location: Short Stay  Anesthesia Type:General  Level of Consciousness: awake, alert , oriented and patient cooperative  Airway & Oxygen Therapy: Patient Spontanous Breathing  Post-op Assessment: Report given to RN, Post -op Vital signs reviewed and stable and Patient moving all extremities  Post vital signs: Reviewed and stable  Last Vitals:  Vitals Value Taken Time  BP    Temp    Pulse    Resp    SpO2      Last Pain:  Vitals:   03/13/21 0826  TempSrc: Oral  PainSc: 0-No pain      Patients Stated Pain Goal: 5 (03/13/21 0826)  Complications: No notable events documented.

## 2021-03-13 NOTE — Discharge Instructions (Addendum)
You are being discharged to home.  Resume your previous diet.  We are waiting for your pathology results.  Your physician has recommended a repeat upper endoscopy for surveillance based on pathology results.  Continue your present medications, including omeprazole 20 mg qday.

## 2021-03-13 NOTE — Anesthesia Preprocedure Evaluation (Signed)
Anesthesia Evaluation  Patient identified by MRN, date of birth, ID band Patient awake    Reviewed: Allergy & Precautions, NPO status , Patient's Chart, lab work & pertinent test results  History of Anesthesia Complications (+) PONV and history of anesthetic complications  Airway Mallampati: II  TM Distance: >3 FB Neck ROM: Full    Dental  (+) Dental Advisory Given,    Pulmonary sleep apnea ,    Pulmonary exam normal breath sounds clear to auscultation       Cardiovascular Exercise Tolerance: Good hypertension, Pt. on medications Normal cardiovascular exam Rhythm:Regular Rate:Normal     Neuro/Psych    GI/Hepatic GERD  Medicated and Controlled,  Endo/Other  diabetes, Well Controlled, Type 2Hyperthyroidism   Renal/GU      Musculoskeletal  (+) Arthritis ,   Abdominal   Peds  Hematology   Anesthesia Other Findings   Reproductive/Obstetrics                            Anesthesia Physical Anesthesia Plan  ASA: 2  Anesthesia Plan: General   Post-op Pain Management:    Induction: Intravenous  PONV Risk Score and Plan: Propofol infusion  Airway Management Planned: Nasal Cannula and Natural Airway  Additional Equipment:   Intra-op Plan:   Post-operative Plan: Extubation in OR  Informed Consent: I have reviewed the patients History and Physical, chart, labs and discussed the procedure including the risks, benefits and alternatives for the proposed anesthesia with the patient or authorized representative who has indicated his/her understanding and acceptance.     Dental advisory given  Plan Discussed with: CRNA and Surgeon  Anesthesia Plan Comments:         Anesthesia Quick Evaluation

## 2021-03-13 NOTE — H&P (Addendum)
Renee Acevedo is an 70 y.o. female.   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 The patient states that she has been presenting for the last 6 months intermittent episodes of choking when swallowing liquids.  Denies any dysphagia to solid food.  No heartburn, or odynophagia.  Last EGD was performed in 2018 which showed findings consistent with short segment Barrett's esophagus.  Biopsies were negative for dysplasia but consistent with Barrett's esophagus. The patient had myofibroma 12 in June 2022 which was normal, she was given recommendations to swallow liquids better.  Since then she reports she has not present any more choking episodes.  Past Medical History:  Diagnosis Date   Arthritis    Barrett's esophagus 03/29/2017   Diabetes (HCC) 03/29/2017   Essential hypertension, benign 03/29/2017   GERD (gastroesophageal reflux disease)    High cholesterol 03/29/2017   High cholesterol 03/29/2017   Hyperthyroidism 03/29/2017   Hyperthyroidism 03/29/2017   PONV (postoperative nausea and vomiting)    Sleep apnea     Past Surgical History:  Procedure Laterality Date   BIOPSY  07/13/2017   Procedure: BIOPSY;  Surgeon: Malissa Hippo, MD;  Location: [facility];  Service: Endoscopy;;  esophagus   CATARACT EXTRACTION     both eyes   CHOLECYSTECTOMY     ESOPHAGOGASTRODUODENOSCOPY N/A 07/13/2017   Procedure: ESOPHAGOGASTRODUODENOSCOPY (EGD);  Surgeon: Malissa Hippo, MD;  Location: [facility];  Service: Endoscopy;  Laterality: N/A;  12:00   PARTIAL HYSTERECTOMY      History reviewed. No pertinent family history. Social History:  reports that she has never smoked. She has never used smokeless tobacco. She reports that she does not drink alcohol and does not use drugs.  Allergies:  Allergies  Allergen Reactions   Avelox [Moxifloxacin Hcl In Nacl]     Burning her skin.  blisters   Codeine Nausea And Vomiting    Cough syrup     Medications Prior to Admission  Medication Sig Dispense Refill   acetaminophen (TYLENOL) 500 MG tablet Take 1,000 mg by mouth daily as needed for moderate pain or headache.     aspirin EC 81 MG tablet Take 81 mg by mouth at bedtime.     atorvastatin (LIPITOR) 40 MG tablet Take 40 mg by mouth daily.     Azelaic Acid 15 % gel Apply 1 application topically 4 (four) times a week. To face     calcium-vitamin D (OSCAL WITH D) 500-200 MG-UNIT tablet Take 1 tablet by mouth every other day.      Cholecalciferol (VITAMIN D3) 5000 units CAPS Take 5,000 Units by mouth daily.     CINNAMON PO Take 1,000 mg by mouth daily.     doxycycline (VIBRAMYCIN) 50 MG capsule Take 50 mg by mouth every other day.     ezetimibe (ZETIA) 10 MG tablet Take 10 mg by mouth daily.     levothyroxine (SYNTHROID, LEVOTHROID) 50 MCG tablet Take 50 mcg by mouth every other day. Alternate with every other day     levothyroxine (SYNTHROID, LEVOTHROID) 75 MCG tablet Take 75 mcg by mouth every other day. Alternate with every other day     lisinopril (PRINIVIL,ZESTRIL) 20 MG tablet Take 10 mg by mouth daily.     Magnesium 500 MG TABS Take 500 mg by mouth daily.     Omega-3-6-9 CAPS Take 2 capsules by mouth daily. 2400 mg per  cap     omeprazole (PRILOSEC) 20 MG capsule Take 20 mg by mouth daily.     Propylene Glycol (SYSTANE BALANCE OP) Apply 1 drop to eye daily as needed (dry eyes).     vitamin C (ASCORBIC ACID) 250 MG tablet Take 250 mg by mouth daily.      Results for orders placed or performed during the hospital encounter of 03/13/21 (from the past 48 hour(s))  Glucose, capillary     Status: Abnormal   Collection Time: 03/13/21  8:32 AM  Result Value Ref Range   Glucose-Capillary 127 (H) 70 - 99 mg/dL    Comment: Glucose reference range applies only to samples taken after fasting for at least 8 hours.   No results found.  Review of Systems   Constitutional: Negative.   HENT:  Positive for trouble swallowing.   Eyes: Negative.   Respiratory: Negative.    Cardiovascular: Negative.   Gastrointestinal: Negative.   Endocrine: Negative.   Genitourinary: Negative.   Musculoskeletal: Negative.   Skin: Negative.   Allergic/Immunologic: Negative.   Neurological: Negative.   Hematological: Negative.   Psychiatric/Behavioral: Negative.     Blood pressure 129/70, pulse 63, temperature 98.2 F (36.8 C), temperature source Oral, resp. rate 18, SpO2 98 %. Physical Exam  GENERAL: The patient is AO x3, in no acute distress. HEENT: Head is normocephalic and atraumatic. EOMI are intact. Mouth is well hydrated and without lesions. NECK: Supple. No masses LUNGS: Clear to auscultation. No presence of rhonchi/wheezing/rales. Adequate chest expansion HEART: RRR, normal s1 and s2. ABDOMEN: Soft, nontender, no guarding, no peritoneal signs, and nondistended. BS +. No masses  EXTREMITIES: Without any cyanosis, clubbing, rash, lesions or edema. NEUROLOGIC: AOx3, no focal motor deficit. 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  We will proceed with EGD and possible biopsies for Barrett's esophagus surveillance.  Dolores Frame, MD 03/13/2021, 8:42 AM

## 2021-03-13 NOTE — Anesthesia Postprocedure Evaluation (Signed)
Anesthesia Post Note  Patient: Renee Acevedo  Procedure(s) Performed: ESOPHAGOGASTRODUODENOSCOPY (EGD) WITH PROPOFOL ESOPHAGEAL DILATION BIOPSY  Patient location during evaluation: PACU Anesthesia Type: General Level of consciousness: awake and alert and oriented Pain management: pain level controlled Vital Signs Assessment: post-procedure vital signs reviewed and stable Respiratory status: spontaneous breathing and respiratory function stable Cardiovascular status: blood pressure returned to baseline and stable Postop Assessment: no apparent nausea or vomiting Anesthetic complications: no   No notable events documented.   Last Vitals:  Vitals:   03/13/21 1012 03/13/21 1017  BP:  (!) 107/58  Pulse: 67   Resp: 16   Temp:    SpO2: 95%     Last Pain:  Vitals:   03/13/21 1012  TempSrc:   PainSc: 0-No pain                 Renee Acevedo

## 2021-03-13 NOTE — Op Note (Addendum)
[facility] Patient Name: Renee Acevedo Abt Procedure Date: 03/13/2021 9:46 AM MRN: [id number] Date of Birth: 11/15/1950 Attending MD: Katrinka Blazinganiel Castaneda ,  CSN: [id number] Age: 70 Admit Type: Outpatient Procedure:                Upper GI endoscopy Indications:              Dysphagia, Follow-up of Barrett's esophagus Providers:                Katrinka Blazinganiel Castaneda, Jannett CelestineAnitra Bell, RN, Edythe ClarityKelly Cox,                            Technician Referring MD:              Medicines:                Monitored Anesthesia Care Complications:            No immediate complications. Estimated Blood Loss:     Estimated blood loss: none. Procedure:                Pre-Anesthesia Assessment:                           - Prior to the procedure, a History and Physical                            was performed, and patient medications, allergies                            and sensitivities were reviewed. The patient's                            tolerance of previous anesthesia was reviewed.                           - The risks and benefits of the procedure and the                            sedation options and risks were discussed with the                            patient. All questions were answered and informed                            consent was obtained.                           - ASA Grade Assessment: II - A patient with mild                            systemic disease.                           After obtaining informed consent, the endoscope was                            passed under direct vision. Throughout the  procedure, the patient's blood pressure, pulse, and                            oxygen saturations were monitored continuously. The                            [id number]-H190 ([id number]) scope was introduced through the                            mouth, and advanced to the second part of duodenum.                            The upper GI endoscopy was accomplished without                             difficulty. The patient tolerated the procedure                            well. Scope In: 9:56:27 AM Scope Out: 10:08:34 AM Total Procedure Duration: 0 hours 12 minutes 7 seconds  Findings:      No endoscopic abnormality was evident in the esophagus to explain the       patient's complaint of dysphagia. It was decided, however, to proceed       with dilation of the entire esophagus. A guidewire was placed and the       scope was withdrawn. Dilation was performed with a Savary dilator with       mild resistance at 18 mm.      The esophagus and gastroesophageal junction were examined with white       light and narrow band imaging (NBI). There were esophageal mucosal       changes consistent with short-segment Barrett's esophagus, classified as       Barrett's stage C2-M2 per Prague criteria. These changes involved the       mucosa at the upper extent of the gastric folds (35 cm from the       incisors) extending to the Z-line (37 cm from the incisors).       Circumferential salmon-colored mucosa was present from 35 to 37 cm. The       maximum longitudinal extent of these esophageal mucosal changes was 2 cm       in length. This was biopsied with a cold forceps for histology.      The entire examined stomach was normal.      The examined duodenum was normal. Impression:               - No endoscopic esophageal abnormality to explain                            patient's dysphagia. Esophagus dilated. Heme was                            seen in the upper third of the esophagus.                           - Esophageal mucosal changes consistent with  short-segment Barrett's esophagus, classified as                            Barrett's stage C2-M2 per Prague criteria. Biopsied.                           - Normal stomach.                           - Normal examined duodenum. Moderate Sedation:      Per Anesthesia Care Recommendation:           -  Discharge patient to home (ambulatory).                           - Resume previous diet.                           - Await pathology results.                           - Repeat upper endoscopy for surveillance based on                            pathology results.                           - Continue present medications, including                            omeprazole 20 mg qday. Procedure Code(s):        --- Professional ---                           [phone], Esophagogastroduodenoscopy, flexible,                            transoral; with insertion of guide wire followed by                            passage of dilator(s) through esophagus over guide                            wire                           43239, 59, Esophagogastroduodenoscopy, flexible,                            transoral; with biopsy, single or multiple Diagnosis Code(s):        --- Professional ---                           R13.10, Dysphagia, unspecified                           K22.70, Barrett's esophagus without dysplasia CPT copyright 2019 American Medical Association. All rights reserved. The codes documented in this report are  preliminary and upon coder review may  be revised to meet current compliance requirements. Katrinka Blazing, MD Katrinka Blazing,  03/13/2021 10:15:12 AM This report has been signed electronically. Number of Addenda: 0

## 2021-03-17 LAB — SURGICAL PATHOLOGY

## 2021-03-24 ENCOUNTER — Encounter (HOSPITAL_COMMUNITY): Payer: Self-pay | Admitting: Gastroenterology

## 2022-03-03 ENCOUNTER — Encounter (INDEPENDENT_AMBULATORY_CARE_PROVIDER_SITE_OTHER): Payer: Self-pay | Admitting: *Deleted

## 2022-03-31 ENCOUNTER — Telehealth (INDEPENDENT_AMBULATORY_CARE_PROVIDER_SITE_OTHER): Payer: Self-pay

## 2022-03-31 ENCOUNTER — Encounter (INDEPENDENT_AMBULATORY_CARE_PROVIDER_SITE_OTHER): Payer: Self-pay | Admitting: *Deleted

## 2022-03-31 ENCOUNTER — Other Ambulatory Visit (INDEPENDENT_AMBULATORY_CARE_PROVIDER_SITE_OTHER): Payer: Self-pay

## 2022-03-31 ENCOUNTER — Encounter (INDEPENDENT_AMBULATORY_CARE_PROVIDER_SITE_OTHER): Payer: Self-pay

## 2022-03-31 DIAGNOSIS — Z1211 Encounter for screening for malignant neoplasm of colon: Secondary | ICD-10-CM

## 2022-03-31 MED ORDER — PEG 3350-KCL-NA BICARB-NACL 420 G PO SOLR: 4000.0000 mL | ORAL | 0 refills | Status: DC

## 2022-03-31 NOTE — Telephone Encounter (Signed)
Elbert Spickler Ann Daryon Remmert, CMA  ?

## 2022-03-31 NOTE — Telephone Encounter (Signed)
Referring MD/PCP: Pomposini  Procedure: Tcs  Reason/Indication:  Screening   Has patient had this procedure before?  Yes   If so, when, by whom and where?  2013  Is there a family history of colon cancer?  No   Who?  What age when diagnosed?    Is patient diabetic? If yes, Type 1 or Type 2   yes, type 2      Does patient have prosthetic heart valve or mechanical valve?  no  Do you have a pacemaker/defibrillator?  no  Has patient ever had endocarditis/atrial fibrillation? no  Does patient use oxygen? Yes, nightly CPAP   Has patient had joint replacement within last 12 months?  no  Is patient constipated or do they take laxatives? yes  Does patient have a history of alcohol/drug use?  No no   Have you had a stroke/heart attack last 6 mths? no  Do you take medicine for weight loss?  No   For female patients,: have you had a hysterectomy yes                       are you post menopausal no                       do you still have your menstrual cycle no   Is patient on blood thinner such as Coumadin, Plavix and/or Aspirin? yes  Medications: asa 81 mg daily, omeprazole 20 mg daily, atorvastatin 40 mg daily, ezetimide 10 mg daily, doxycycline 50 mg every other day, fish oil 5000 mg daily, Vit D 5000 daily, Benadryl 25 mg daily, Synthroid 50 mg daily, Synthroid 25 every other night  Allergies: AVELOX  Medication Adjustment per Dr Levon Hedger NONE   Procedure date & time: 04/22/22 at 12:15

## 2022-04-22 ENCOUNTER — Encounter (HOSPITAL_COMMUNITY): Payer: Self-pay | Admitting: Gastroenterology

## 2022-04-22 ENCOUNTER — Ambulatory Visit (HOSPITAL_COMMUNITY): Payer: Medicare Other | Admitting: Anesthesiology

## 2022-04-22 ENCOUNTER — Encounter (HOSPITAL_COMMUNITY)
Admission: RE | Disposition: A | Discharge status: Home / Self Care | Payer: Self-pay | Source: Ambulatory Visit | Attending: Gastroenterology

## 2022-04-22 ENCOUNTER — Ambulatory Visit (HOSPITAL_BASED_OUTPATIENT_CLINIC_OR_DEPARTMENT_OTHER): Payer: Medicare Other | Admitting: Anesthesiology

## 2022-04-22 ENCOUNTER — Other Ambulatory Visit: Payer: Self-pay

## 2022-04-22 ENCOUNTER — Ambulatory Visit (HOSPITAL_COMMUNITY)
Admission: RE | Admit: 2022-04-22 | Discharge: 2022-04-22 | Disposition: A | Discharge status: Home / Self Care | Payer: Medicare Other | Source: Ambulatory Visit | Attending: Gastroenterology | Admitting: Gastroenterology

## 2022-04-22 DIAGNOSIS — E785 Hyperlipidemia, unspecified: Secondary | ICD-10-CM | POA: Insufficient documentation

## 2022-04-22 DIAGNOSIS — K219 Gastro-esophageal reflux disease without esophagitis: Secondary | ICD-10-CM | POA: Insufficient documentation

## 2022-04-22 DIAGNOSIS — G4730 Sleep apnea, unspecified: Secondary | ICD-10-CM | POA: Insufficient documentation

## 2022-04-22 DIAGNOSIS — Z1211 Encounter for screening for malignant neoplasm of colon: Secondary | ICD-10-CM

## 2022-04-22 DIAGNOSIS — E039 Hypothyroidism, unspecified: Secondary | ICD-10-CM | POA: Insufficient documentation

## 2022-04-22 DIAGNOSIS — K635 Polyp of colon: Secondary | ICD-10-CM | POA: Diagnosis not present

## 2022-04-22 DIAGNOSIS — M1990 Unspecified osteoarthritis, unspecified site: Secondary | ICD-10-CM | POA: Insufficient documentation

## 2022-04-22 DIAGNOSIS — Z8719 Personal history of other diseases of the digestive system: Secondary | ICD-10-CM | POA: Diagnosis not present

## 2022-04-22 DIAGNOSIS — E119 Type 2 diabetes mellitus without complications: Secondary | ICD-10-CM | POA: Insufficient documentation

## 2022-04-22 DIAGNOSIS — K648 Other hemorrhoids: Secondary | ICD-10-CM

## 2022-04-22 DIAGNOSIS — K5730 Diverticulosis of large intestine without perforation or abscess without bleeding: Secondary | ICD-10-CM

## 2022-04-22 HISTORY — PX: COLONOSCOPY WITH PROPOFOL: SHX5780

## 2022-04-22 HISTORY — PX: POLYPECTOMY: SHX5525

## 2022-04-22 LAB — HM COLONOSCOPY

## 2022-04-22 SURGERY — COLONOSCOPY WITH PROPOFOL
Anesthesia: General

## 2022-04-22 MED ORDER — PROPOFOL 500 MG/50ML IV EMUL
INTRAVENOUS | Status: DC | PRN
  Administered 2022-04-22: 150 ug/kg/min via INTRAVENOUS

## 2022-04-22 MED ORDER — PROPOFOL 500 MG/50ML IV EMUL
INTRAVENOUS | Status: AC
  Filled 2022-04-22: qty 350

## 2022-04-22 MED ORDER — PHENYLEPHRINE 80 MCG/ML (10ML) SYRINGE FOR IV PUSH (FOR BLOOD PRESSURE SUPPORT)
PREFILLED_SYRINGE | INTRAVENOUS | Status: AC
  Filled 2022-04-22: qty 10

## 2022-04-22 MED ORDER — LIDOCAINE HCL (PF) 2 % IJ SOLN
INTRAMUSCULAR | Status: AC
  Filled 2022-04-22: qty 5

## 2022-04-22 MED ORDER — STERILE WATER FOR IRRIGATION IR SOLN
Status: DC | PRN
  Administered 2022-04-22: 60 mL

## 2022-04-22 MED ORDER — PHENYLEPHRINE 80 MCG/ML (10ML) SYRINGE FOR IV PUSH (FOR BLOOD PRESSURE SUPPORT)
PREFILLED_SYRINGE | INTRAVENOUS | Status: DC | PRN
  Administered 2022-04-22 (×5): 80 ug via INTRAVENOUS

## 2022-04-22 MED ORDER — PROPOFOL 10 MG/ML IV BOLUS
INTRAVENOUS | Status: AC
  Filled 2022-04-22: qty 20

## 2022-04-22 MED ORDER — PROPOFOL 500 MG/50ML IV EMUL
INTRAVENOUS | Status: AC
  Filled 2022-04-22: qty 50

## 2022-04-22 MED ORDER — PROPOFOL 10 MG/ML IV BOLUS
INTRAVENOUS | Status: AC
  Filled 2022-04-22: qty 40

## 2022-04-22 MED ORDER — LACTATED RINGERS IV SOLN: INTRAVENOUS | Status: DC

## 2022-04-22 MED ORDER — EPHEDRINE 5 MG/ML INJ
INTRAVENOUS | Status: AC
  Filled 2022-04-22: qty 5

## 2022-04-22 MED ORDER — PROPOFOL 10 MG/ML IV BOLUS
INTRAVENOUS | Status: DC | PRN
  Administered 2022-04-22: 100 mg via INTRAVENOUS

## 2022-04-22 MED ORDER — EPHEDRINE SULFATE (PRESSORS) 50 MG/ML IJ SOLN
INTRAMUSCULAR | Status: DC | PRN
  Administered 2022-04-22: 5 mg via INTRAVENOUS
  Administered 2022-04-22: 10 mg via INTRAVENOUS

## 2022-04-22 NOTE — Anesthesia Preprocedure Evaluation (Addendum)
Anesthesia Evaluation  Patient identified by MRN, date of birth, ID band Patient awake    Reviewed: Allergy & Precautions, NPO status , Patient's Chart, lab work & pertinent test results  History of Anesthesia Complications (+) PONV and history of anesthetic complications  Airway Mallampati: II  TM Distance: >3 FB Neck ROM: Full    Dental  (+) Dental Advisory Given, Teeth Intact   Pulmonary sleep apnea and Continuous Positive Airway Pressure Ventilation ,    Pulmonary exam normal breath sounds clear to auscultation       Cardiovascular hypertension, Pt. on medications Normal cardiovascular exam Rhythm:Regular Rate:Normal     Neuro/Psych negative neurological ROS  negative psych ROS   GI/Hepatic Neg liver ROS, GERD  Medicated and Controlled,  Endo/Other  diabetes, Well Controlled, Type 2Hypothyroidism   Renal/GU negative Renal ROS  negative genitourinary   Musculoskeletal negative musculoskeletal ROS (+)   Abdominal   Peds negative pediatric ROS (+)  Hematology negative hematology ROS (+)   Anesthesia Other Findings   Reproductive/Obstetrics negative OB ROS                           Anesthesia Physical Anesthesia Plan  ASA: 2  Anesthesia Plan: General   Post-op Pain Management: Minimal or no pain anticipated   Induction: Intravenous  PONV Risk Score and Plan: Propofol infusion  Airway Management Planned: Nasal Cannula and Natural Airway  Additional Equipment:   Intra-op Plan:   Post-operative Plan:   Informed Consent: I have reviewed the patients History and Physical, chart, labs and discussed the procedure including the risks, benefits and alternatives for the proposed anesthesia with the patient or authorized representative who has indicated his/her understanding and acceptance.     Dental advisory given  Plan Discussed with: CRNA and Surgeon  Anesthesia Plan  Comments:         Anesthesia Quick Evaluation

## 2022-04-22 NOTE — Anesthesia Postprocedure Evaluation (Signed)
Anesthesia Post Note  Patient: Renee Acevedo  Procedure(s) Performed: COLONOSCOPY WITH PROPOFOL POLYPECTOMY  Patient location during evaluation: Phase II Anesthesia Type: General Level of consciousness: awake and alert and oriented Pain management: pain level controlled Vital Signs Assessment: post-procedure vital signs reviewed and stable Respiratory status: spontaneous breathing, nonlabored ventilation and respiratory function stable Cardiovascular status: blood pressure returned to baseline and stable Postop Assessment: no apparent nausea or vomiting Anesthetic complications: no   No notable events documented.   Last Vitals:  Vitals:   04/22/22 1241 04/22/22 1244  BP: (!) 84/43 (!) 93/43  Pulse: 75 76  Resp: 18 20  Temp: (!) 36.4 C   SpO2: 99% 99%    Last Pain:  Vitals:   04/22/22 1244  TempSrc:   PainSc: 0-No pain                 Talani Brazee C Wilson Dusenbery

## 2022-04-22 NOTE — Transfer of Care (Signed)
Immediate Anesthesia Transfer of Care Note  Patient: Renee Acevedo  Procedure(s) Performed: COLONOSCOPY WITH PROPOFOL POLYPECTOMY  Patient Location: Endoscopy Unit  Anesthesia Type:General  Level of Consciousness: awake, alert  and oriented  Airway & Oxygen Therapy: Patient Spontanous Breathing  Post-op Assessment: Report given to RN and Post -op Vital signs reviewed and stable  Post vital signs: Reviewed and stable  Last Vitals:  Vitals Value Taken Time  BP 93/43 04/22/22 1244  Temp 36.4 C 04/22/22 1241  Pulse 76 04/22/22 1244  Resp 20 04/22/22 1244  SpO2 99 % 04/22/22 1244    Last Pain:  Vitals:   04/22/22 1244  TempSrc:   PainSc: 0-No pain      Patients Stated Pain Goal: 7 (04/22/22 1050)  Complications: No notable events documented.

## 2022-04-22 NOTE — Op Note (Signed)
[facility] Patient Name: Renee Acevedo Procedure Date: 04/22/2022 11:31 AM MRN: [id number] Date of Birth: 04-20-1951 Attending MD: Katrinka Blazing ,  CSN: [id number] Age: 71 Admit Type: Outpatient Procedure:                Colonoscopy Indications:              Screening for colorectal malignant neoplasm Providers:                Katrinka Blazing, Edrick Kins, RN, Angelica Ran,                            Lennice Sites Technician, Technician Referring MD:              Medicines:                Monitored Anesthesia Care Complications:            No immediate complications. Estimated Blood Loss:     Estimated blood loss: none. Procedure:                Pre-Anesthesia Assessment:                           - Prior to the procedure, a History and Physical                            was performed, and patient medications, allergies                            and sensitivities were reviewed. The patient's                            tolerance of previous anesthesia was reviewed.                           - The risks and benefits of the procedure and the                            sedation options and risks were discussed with the                            patient. All questions were answered and informed                            consent was obtained.                           - ASA Grade Assessment: II - A patient with mild                            systemic disease.                           After obtaining informed consent, the colonoscope                            was passed under direct vision. Throughout the  procedure, the patient's blood pressure, pulse, and                            oxygen saturations were monitored continuously. The                            [id number]-[id number] [id number]) scope was introduced through                            the anus and advanced to the the cecum, identified                            by appendiceal orifice and ileocecal  valve. The                            colonoscopy was performed without difficulty. The                            patient tolerated the procedure well. The quality                            of the bowel preparation was excellent. Scope In: 12:20:47 PM Scope Out: 12:39:07 PM Scope Withdrawal Time: 0 hours 13 minutes 20 seconds  Total Procedure Duration: 0 hours 18 minutes 20 seconds  Findings:      The perianal and digital rectal examinations were normal.      A 3 mm polyp was found in the descending colon. The polyp was sessile.       The polyp was removed with a cold snare. Resection and retrieval were       complete.      A few small-mouthed diverticula were found in the sigmoid colon.      Non-bleeding internal hemorrhoids were found during retroflexion. The       hemorrhoids were small. Impression:               - One 3 mm polyp in the descending colon, removed                            with a cold snare. Resected and retrieved.                           - Diverticulosis in the sigmoid colon.                           - Non-bleeding internal hemorrhoids. Moderate Sedation:      Per Anesthesia Care Recommendation:           - Discharge patient to home (ambulatory).                           - Resume previous diet.                           - Await pathology results.                           -  Repeat colonoscopy for surveillance based on                            pathology results. Procedure Code(s):        --- Professional ---                           [phone], Colonoscopy, flexible; with removal of                            tumor(s), polyp(s), or other lesion(s) by snare                            technique Diagnosis Code(s):        --- Professional ---                           Z12.11, Encounter for screening for malignant                            neoplasm of colon                           K63.5, Polyp of colon                           K64.8, Other hemorrhoids                            K57.30, Diverticulosis of large intestine without                            perforation or abscess without bleeding CPT copyright 2019 American Medical Association. All rights reserved. The codes documented in this report are preliminary and upon coder review may  be revised to meet current compliance requirements. Katrinka Blazing, MD Katrinka Blazing,  04/22/2022 12:42:46 PM This report has been signed electronically. Number of Addenda: 0

## 2022-04-22 NOTE — H&P (Signed)
Renee Acevedo is an 71 y.o. female.   Chief Complaint: screening colonoscopy HPI: 71 y/o F with past medical history of Barrett's esophagus, GERD, diabetes, hyperlipidemia, hypothyroidism, sleep apnea and arthritis, coming for screening colonoscopy.  States her last colonoscopy was performed 10 years ago which was normal.  The patient denies having any complaints such as melena, hematochezia, abdominal pain or distention, change in her bowel movement consistency or frequency, no changes in weight recently.  No family history of colorectal cancer.   Past Medical History:  Diagnosis Date   Arthritis    Barrett's esophagus 03/29/2017   Diabetes (HCC) 03/29/2017   Essential hypertension, benign 03/29/2017   GERD (gastroesophageal reflux disease)    High cholesterol 03/29/2017   High cholesterol 03/29/2017   Hyperthyroidism 03/29/2017   Hyperthyroidism 03/29/2017   PONV (postoperative nausea and vomiting)    Sleep apnea     Past Surgical History:  Procedure Laterality Date   BIOPSY  07/13/2017   Procedure: BIOPSY;  Surgeon: Malissa Hippo, MD;  [facility];  Service: Endoscopy;;  esophagus   BIOPSY  03/13/2021   Procedure: BIOPSY;  Surgeon: Dolores Frame, MD;  Location: [facility];  Service: Gastroenterology;;   CATARACT EXTRACTION     both eyes   CHOLECYSTECTOMY     ESOPHAGEAL DILATION  03/13/2021   Procedure: ESOPHAGEAL DILATION;  Surgeon: Dolores Frame, MD;  [facility]: [facility];  Service: Gastroenterology;;   ESOPHAGOGASTRODUODENOSCOPY N/A 07/13/2017   Procedure: ESOPHAGOGASTRODUODENOSCOPY (EGD);  Surgeon: Malissa Hippo, MD;  Location: [facility];  Service: Endoscopy;  Laterality: N/A;  12:00   ESOPHAGOGASTRODUODENOSCOPY (EGD) WITH PROPOFOL N/A 03/13/2021   Procedure: ESOPHAGOGASTRODUODENOSCOPY (EGD) WITH PROPOFOL;  Surgeon: Dolores Frame, MD;  [facility]: [facility];  Service: Gastroenterology;  Laterality: N/A;  1:05-pt  notified of new arrival time of 8:15am   PARTIAL HYSTERECTOMY      History reviewed. No pertinent family history. Social History:  reports that she has never smoked. She has never used smokeless tobacco. She reports that she does not drink alcohol and does not use drugs.  Allergies:  Allergies  Allergen Reactions   Avelox [Moxifloxacin Hcl In Nacl]     Burning her skin. blisters   Codeine Nausea And Vomiting    Cough syrup     Medications Prior to Admission  Medication Sig Dispense Refill   acetaminophen (TYLENOL) 500 MG tablet Take 1,000 mg by mouth daily as needed for moderate pain or headache.     aspirin EC 81 MG tablet Take 81 mg by mouth at bedtime.     atorvastatin (LIPITOR) 40 MG tablet Take 40 mg by mouth at bedtime.     calcium-vitamin D (OSCAL WITH D) 500-200 MG-UNIT tablet Take 1 tablet by mouth once a week.     Cholecalciferol (VITAMIN D3) 5000 units CAPS Take 5,000 Units by mouth at bedtime.     CINNAMON PO Take 1,000 mg by mouth at bedtime.     doxycycline (VIBRAMYCIN) 50 MG capsule Take 50 mg by mouth See admin instructions. Every two to three days     ezetimibe (ZETIA) 10 MG tablet Take 10 mg by mouth daily.     hydroxypropyl methylcellulose / hypromellose (ISOPTO TEARS / GONIOVISC) 2.5 % ophthalmic solution Place 1 drop into both eyes daily as needed for dry eyes.     levothyroxine (SYNTHROID, LEVOTHROID) 50 MCG tablet Take 50 mcg by mouth every other day. Alternate with every other day  levothyroxine (SYNTHROID, LEVOTHROID) 75 MCG tablet Take 75 mcg by mouth every other day. Alternate with every other day     lisinopril (PRINIVIL,ZESTRIL) 20 MG tablet Take 10 mg by mouth daily as needed (if for high blood pressure).     Magnesium 500 MG TABS Take 500 mg by mouth at bedtime.     metroNIDAZOLE (METROGEL) 0.75 % gel Apply 1 Application topically every 3 (three) days.     Omega-3 Fatty Acids (FISH OIL PO) Take 5,000 mg by mouth at bedtime. Soft gel      omeprazole (PRILOSEC) 20 MG capsule Take 20 mg by mouth daily.     vitamin C (ASCORBIC ACID) 250 MG tablet Take 250 mg by mouth every other day. gummy     polyethylene glycol-electrolytes (TRILYTE) 420 g solution Take 4,000 mLs by mouth as directed. 4000 mL 0    No results found for this or any previous visit (from the past 48 hour(s)). No results found.  Review of Systems  All other systems reviewed and are negative.   Blood pressure (!) 146/89, pulse 78, temperature 97.9 F (36.6 C), temperature source Oral, resp. rate 12, height 5\' 1"  (1.549 m), weight 70.8 kg, SpO2 100 %. Physical Exam  GENERAL: The patient is AO x3, in no acute distress. HEENT: Head is normocephalic and atraumatic. EOMI are intact. Mouth is well hydrated and without lesions. NECK: Supple. No masses LUNGS: Clear to auscultation. No presence of rhonchi/wheezing/rales. Adequate chest expansion HEART: RRR, normal s1 and s2. ABDOMEN: Soft, nontender, no guarding, no peritoneal signs, and nondistended. BS +. No masses. EXTREMITIES: Without any cyanosis, clubbing, rash, lesions or edema. NEUROLOGIC: AOx3, no focal motor deficit. SKIN: no jaundice, no rashes  Assessment/Plan 71 y/o F with past medical history of Barrett's esophagus, GERD, diabetes, hyperlipidemia, hypothyroidism, sleep apnea and arthritis, coming for screening colonoscopy. The patient is at average risk for colorectal cancer.  We will proceed with colonoscopy today.   63, MD 04/22/2022, 11:03 AM

## 2022-04-22 NOTE — Discharge Instructions (Signed)
You are being discharged to home.  Resume your previous diet.  We are waiting for your pathology results.  Your physician has recommended a repeat colonoscopy for surveillance based on pathology results.  

## 2022-04-26 ENCOUNTER — Encounter (INDEPENDENT_AMBULATORY_CARE_PROVIDER_SITE_OTHER): Payer: Self-pay | Admitting: *Deleted

## 2022-04-26 LAB — SURGICAL PATHOLOGY

## 2022-04-27 ENCOUNTER — Encounter (HOSPITAL_COMMUNITY): Payer: Self-pay | Admitting: Gastroenterology

## 2022-06-19 IMAGING — RF DG SWALLOWING FUNCTION
1 series · 1 of 1 positions shown · non-contrast
Comparison: None

CLINICAL DATA: Dysphagia. Choking with liquids

EXAM:
MODIFIED BARIUM SWALLOW
TECHNIQUE: Different consistencies of barium were administered orally to the
patient by the Speech Pathologist. Imaging of the pharynx was
performed in the lateral projection. The radiologist was present in
the fluoroscopy room for this study, providing personal supervision.
FLUOROSCOPY TIME:  Fluoroscopy Time:  1 minutes 6 seconds
Radiation Exposure Index (if provided by the fluoroscopic device):
19.5 mGy
Number of Acquired Spot Images: multiple fluoroscopic screen
captures

[Series 1: cp_standard · 0.25mm/px · 1 of 1 slices shown]
[im 1/1]
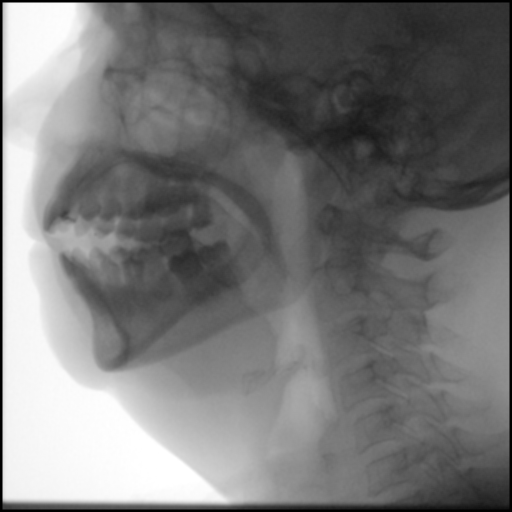

[1 of 1 positions shown; findings below may reference images not displayed]

FINDINGS: Tiny Zenker diverticulum noted.

Minimal premature spillover of contrast with neck extended.

Otherwise normal swallowing of thin barium, applesauce and cracker
consistency.

No laryngeal penetration, aspiration or residuals.

Patient swallowed a 12.5 mm diameter barium tablet without
difficulty.
IMPRESSION: Minimal premature spillover of contrast with neck extension.

Tiny Zenker diverticulum.

Otherwise negative exam.

Please refer to the Speech Pathologists report for complete details
and recommendations.

## 2024-02-17 ENCOUNTER — Encounter (INDEPENDENT_AMBULATORY_CARE_PROVIDER_SITE_OTHER): Payer: Self-pay | Admitting: *Deleted

## 2024-03-08 ENCOUNTER — Ambulatory Visit (INDEPENDENT_AMBULATORY_CARE_PROVIDER_SITE_OTHER): Admitting: Gastroenterology

## 2024-03-13 ENCOUNTER — Telehealth: Payer: Self-pay

## 2024-03-13 NOTE — Telephone Encounter (Signed)
 Who is your primary care physician: Dr.Daniel Addis  Are you diabetic? If yes, Type 1 or Type 2?    Yes Type 2  Do you have a prosthetic or mechanical heart valve? no  Do you have a pacemaker/defibrillator?   no  Have you had endocarditis/atrial fibrillation? no  Have you had joint replacement within the last 12 months?  no  Do you tend to be constipated or have to use laxatives? yes  Do you have any history of drugs or alchohol?  no  Do you use supplemental oxygen?  no  Have you had a stroke or heart attack within the last 6 months? no  Do you take weight loss medication?  no  For female patients: have you had a hysterectomy?  yes                                     are you post menopausal?       yes                                           do you still have your menstrual cycle? no      Do you take any blood-thinning medications such as: (aspirin, warfarin, Plavix, Aggrenox)  yes  If yes we need the name, milligram, dosage and who is prescribing doctor Aspirin 81 mg Current Outpatient Medications on File Prior to Visit  Medication Sig Dispense Refill   acetaminophen (TYLENOL) 500 MG tablet Take 1,000 mg by mouth daily as needed for moderate pain or headache.     aspirin EC 81 MG tablet Take 81 mg by mouth at bedtime.     atorvastatin (LIPITOR) 40 MG tablet Take 40 mg by mouth at bedtime.     calcium-vitamin D (OSCAL WITH D) 500-200 MG-UNIT tablet Take 1 tablet by mouth once a week.     Cholecalciferol (VITAMIN D3) 5000 units CAPS Take 5,000 Units by mouth at bedtime.     CINNAMON PO Take 1,000 mg by mouth at bedtime.     doxycycline (VIBRAMYCIN) 50 MG capsule Take 50 mg by mouth See admin instructions. Every two to three days     ezetimibe (ZETIA) 10 MG tablet Take 10 mg by mouth daily.     hydroxypropyl methylcellulose / hypromellose (ISOPTO TEARS / GONIOVISC) 2.5 % ophthalmic solution Place 1 drop into both eyes daily as needed for dry eyes.     levothyroxine (SYNTHROID,  LEVOTHROID) 50 MCG tablet Take 50 mcg by mouth every other day. Alternate with 75mcg every other day     levothyroxine (SYNTHROID, LEVOTHROID) 75 MCG tablet Take 75 mcg by mouth every other day. Alternate with 50mcg every other day     lisinopril (PRINIVIL,ZESTRIL) 20 MG tablet Take 10 mg by mouth daily as needed (if for high blood pressure).     Magnesium 500 MG TABS Take 500 mg by mouth at bedtime.     metroNIDAZOLE (METROGEL) 0.75 % gel Apply 1 Application topically every 3 (three) days.     Omega-3 Fatty Acids (FISH OIL PO) Take 5,000 mg by mouth at bedtime. Soft gel     omeprazole (PRILOSEC) 20 MG capsule Take 20 mg by mouth daily.     vitamin C (ASCORBIC ACID) 250 MG tablet Take 250 mg by mouth every  other day. gummy     No current facility-administered medications on file prior to visit.    Allergies  Allergen Reactions   Avelox [Moxifloxacin Hcl In Nacl]     Burning her skin. blisters   Codeine Nausea And Vomiting    Cough syrup      Pharmacy: [facility]  Primary Insurance Name: [employer] [id number], [place] [id number]  Best number where you can be reached: [phone]

## 2024-03-13 NOTE — Telephone Encounter (Signed)
 Spoke with pt. She has been scheduled for EGD 7/9. Aware will send instructions via mychart.

## 2024-03-13 NOTE — Telephone Encounter (Signed)
 Room 1 Thanks

## 2024-03-21 ENCOUNTER — Other Ambulatory Visit: Payer: Self-pay

## 2024-03-21 ENCOUNTER — Ambulatory Visit (HOSPITAL_COMMUNITY)
Admission: RE | Admit: 2024-03-21 | Discharge: 2024-03-21 | Disposition: A | Discharge status: Home / Self Care | Attending: Gastroenterology | Admitting: Gastroenterology

## 2024-03-21 ENCOUNTER — Encounter (HOSPITAL_COMMUNITY)
Admission: RE | Disposition: A | Discharge status: Home / Self Care | Payer: Self-pay | Source: Home / Self Care | Attending: Gastroenterology

## 2024-03-21 ENCOUNTER — Ambulatory Visit (HOSPITAL_COMMUNITY): Admitting: Anesthesiology

## 2024-03-21 ENCOUNTER — Encounter (HOSPITAL_COMMUNITY): Payer: Self-pay | Admitting: Gastroenterology

## 2024-03-21 DIAGNOSIS — K208 Other esophagitis without bleeding: Secondary | ICD-10-CM | POA: Diagnosis not present

## 2024-03-21 DIAGNOSIS — K209 Esophagitis, unspecified without bleeding: Secondary | ICD-10-CM | POA: Diagnosis not present

## 2024-03-21 DIAGNOSIS — G4733 Obstructive sleep apnea (adult) (pediatric): Secondary | ICD-10-CM | POA: Diagnosis not present

## 2024-03-21 DIAGNOSIS — K219 Gastro-esophageal reflux disease without esophagitis: Secondary | ICD-10-CM | POA: Insufficient documentation

## 2024-03-21 DIAGNOSIS — E785 Hyperlipidemia, unspecified: Secondary | ICD-10-CM | POA: Insufficient documentation

## 2024-03-21 DIAGNOSIS — Z09 Encounter for follow-up examination after completed treatment for conditions other than malignant neoplasm: Secondary | ICD-10-CM | POA: Insufficient documentation

## 2024-03-21 DIAGNOSIS — I10 Essential (primary) hypertension: Secondary | ICD-10-CM | POA: Diagnosis not present

## 2024-03-21 DIAGNOSIS — K2270 Barrett's esophagus without dysplasia: Secondary | ICD-10-CM | POA: Diagnosis not present

## 2024-03-21 HISTORY — PX: ESOPHAGOGASTRODUODENOSCOPY: SHX5428

## 2024-03-21 LAB — GLUCOSE, CAPILLARY: Glucose-Capillary: 79 mg/dL (ref 70–99)

## 2024-03-21 SURGERY — EGD (ESOPHAGOGASTRODUODENOSCOPY)
Anesthesia: General

## 2024-03-21 MED ORDER — LACTATED RINGERS IV SOLN
INTRAVENOUS | Status: DC | PRN
Start: 2024-03-21 — End: 2024-03-21

## 2024-03-21 MED ORDER — LIDOCAINE 2% (20 MG/ML) 5 ML SYRINGE
INTRAMUSCULAR | Status: DC | PRN
Start: 2024-03-21 — End: 2024-03-21
  Administered 2024-03-21: 100 mg via INTRAVENOUS

## 2024-03-21 MED ORDER — PHENYLEPHRINE 80 MCG/ML (10ML) SYRINGE FOR IV PUSH (FOR BLOOD PRESSURE SUPPORT)
PREFILLED_SYRINGE | INTRAVENOUS | Status: DC | PRN
  Administered 2024-03-21: 160 ug via INTRAVENOUS

## 2024-03-21 MED ORDER — PROPOFOL 10 MG/ML IV BOLUS
INTRAVENOUS | Status: DC | PRN
  Administered 2024-03-21: 100 mg via INTRAVENOUS
  Administered 2024-03-21: 50 mg via INTRAVENOUS

## 2024-03-21 NOTE — Discharge Instructions (Signed)
 You are being discharged to home.  Resume your previous diet.  We are waiting for your pathology results.  Your physician has recommended a repeat upper endoscopy in three years for surveillance.

## 2024-03-21 NOTE — Transfer of Care (Signed)
 Immediate Anesthesia Transfer of Care Note  Patient: Renee Acevedo  Procedure(s) Performed: EGD (ESOPHAGOGASTRODUODENOSCOPY)  Patient Location: Endoscopy Unit  Anesthesia Type:General  Level of Consciousness: awake, alert , oriented, and patient cooperative  Airway & Oxygen Therapy: Patient Spontanous Breathing  Post-op Assessment: Report given to RN, Post -op Vital signs reviewed and stable, and Patient moving all extremities X 4  Post vital signs: Reviewed and stable  Last Vitals:  Vitals Value Taken Time  BP 115/48 03/21/24 14:09  Temp 36.6 C 03/21/24 14:09  Pulse 62 03/21/24 14:09  Resp 20 03/21/24 14:09  SpO2 97 % 03/21/24 14:09    Last Pain:  Vitals:   03/21/24 1409  TempSrc: Oral  PainSc: 0-No pain      Patients Stated Pain Goal: 5 (03/21/24 1309)  Complications: No notable events documented.

## 2024-03-21 NOTE — Op Note (Signed)
 [facility] Patient Name: Renee Acevedo Procedure Date: 03/21/2024 1:30 PM MRN: [id number] Date of Birth: 1951/01/05 Attending MD: Toribio Fortune , , [id number] CSN: [id number] Age: 73 Admit Type: Outpatient Procedure:                Upper GI endoscopy Indications:              Follow-up of Barrett's esophagus Providers:                Toribio Fortune, Harlene Lips, Rosina Jackolyn Dorcas Alm, Technician Referring MD:              Medicines:                Monitored Anesthesia Care Complications:            No immediate complications. Estimated Blood Loss:     Estimated blood loss: none. Procedure:                Pre-Anesthesia Assessment:                           - Prior to the procedure, a History and Physical                            was performed, and patient medications, allergies                            and sensitivities were reviewed. The patient's                            tolerance of previous anesthesia was reviewed.                           - The risks and benefits of the procedure and the                            sedation options and risks were discussed with the                            patient. All questions were answered and informed                            consent was obtained.                           - ASA Grade Assessment: II - A patient with mild                            systemic disease.                           After obtaining informed consent, the endoscope was                            passed under direct vision. Throughout the  procedure, the patient's blood pressure, pulse, and                            oxygen saturations were monitored continuously. The                            [id number]-H190 ([id number]) scope was introduced through the                            mouth, and advanced to the second part of duodenum.                            The upper GI endoscopy was  accomplished without                            difficulty. The patient tolerated the procedure                            well. Scope In: 1:58:46 PM Scope Out: 2:05:33 PM Total Procedure Duration: 0 hours 6 minutes 47 seconds  Findings:      The esophagus and gastroesophageal junction were examined with white       light and narrow band imaging (NBI). There were esophageal mucosal       changes consistent with short-segment Barrett's esophagus, classified as       Barrett's stage C1-M2 per Prague criteria. These changes involved the       mucosa at the upper extent of the gastric folds (37 cm from the       incisors) extending to the Z-line (35 cm from the incisors). The maximum       longitudinal extent of these esophageal mucosal changes was 2 cm in       length. Mucosa was biopsied with a cold forceps for histology in 4       quadrants at intervals of 1 cm. A total of 2 specimen bottles were sent       to pathology.      The stomach was normal.      The examined duodenum was normal. Impression:               - Esophageal mucosal changes consistent with                            short-segment Barrett's esophagus, classified as                            Barrett's stage C1-M2 per Prague criteria. Biopsied.                           - Normal stomach.                           - Normal examined duodenum. Moderate Sedation:      Per Anesthesia Care Recommendation:           - Discharge patient to home (ambulatory).                           -  Resume previous diet.                           - Await pathology results.                           - Repeat upper endoscopy in 3 years for                            surveillance. Procedure Code(s):        --- Professional ---                           [id number], Esophagogastroduodenoscopy, flexible,                            transoral; with biopsy, single or multiple Diagnosis Code(s):        --- Professional ---                            K22.70, Barrett's esophagus without dysplasia CPT copyright 2022 American Medical Association. All rights reserved. The codes documented in this report are preliminary and upon coder review may  be revised to meet current compliance requirements. Toribio Fortune, MD Toribio Fortune,  03/21/2024 2:18:29 PM This report has been signed electronically. Number of Addenda: 0

## 2024-03-21 NOTE — H&P (Addendum)
 Renee Acevedo is an 73 y.o. female.   Chief Complaint: Barrett's esophagus HPI: 73 y/o with PMH GERD c/b BE, DM, HTN, HLD, hyperthyroidism, OSA, coming for BE surveillance.  The patient denies having any nausea, vomiting, fever, chills, hematochezia, melena, hematemesis, abdominal distention, abdominal pain, diarrhea, jaundice, pruritus or weight loss.   Past Medical History:  Diagnosis Date   Arthritis    Barrett's esophagus 03/29/2017   Diabetes (HCC) 03/29/2017   Essential hypertension, benign 03/29/2017   GERD (gastroesophageal reflux disease)    High cholesterol 03/29/2017   High cholesterol 03/29/2017   Hyperthyroidism 03/29/2017   Hyperthyroidism 03/29/2017   PONV (postoperative nausea and vomiting)    Sleep apnea     Past Surgical History:  Procedure Laterality Date   BIOPSY  07/13/2017   Procedure: BIOPSY;  Surgeon: Golda Claudis PENNER, MD;  [facility];  Service: Endoscopy;;  esophagus   BIOPSY  03/13/2021   Procedure: BIOPSY;  Surgeon: Eartha Angelia Sieving, MD;  [facility];  Service: Gastroenterology;;   CATARACT EXTRACTION     both eyes   CHOLECYSTECTOMY     COLONOSCOPY WITH PROPOFOL  N/A 04/22/2022   Procedure: COLONOSCOPY WITH PROPOFOL ;  Surgeon: Eartha Angelia Sieving, MD;  [facility];  Service: Gastroenterology;  Laterality: N/A;  1215 ASA 2   ESOPHAGEAL DILATION  03/13/2021   Procedure: ESOPHAGEAL DILATION;  Surgeon: Eartha Angelia Sieving, MD;  [facility];  Service: Gastroenterology;;   ESOPHAGOGASTRODUODENOSCOPY N/A 07/13/2017   Procedure: ESOPHAGOGASTRODUODENOSCOPY (EGD);  Surgeon: Golda Claudis PENNER, MD;  [facility];  Service: Endoscopy;  Laterality: N/A;  12:00   ESOPHAGOGASTRODUODENOSCOPY (EGD) WITH PROPOFOL  N/A 03/13/2021   Procedure: ESOPHAGOGASTRODUODENOSCOPY (EGD) WITH PROPOFOL ;  Surgeon: Eartha Angelia Sieving, MD;  [facility];  Service: Gastroenterology;  Laterality: N/A;   1:05-pt notified of new arrival time of 8:15am   PARTIAL HYSTERECTOMY     POLYPECTOMY  04/22/2022   Procedure: POLYPECTOMY;  Surgeon: Eartha Angelia Sieving, MD;  [facility];  Service: Gastroenterology;;    History reviewed. No pertinent family history. Social History:  reports that she has never smoked. She has never used smokeless tobacco. She reports that she does not drink alcohol and does not use drugs.  Allergies:  Allergies  Allergen Reactions   Avelox [Moxifloxacin Hcl In Nacl]     Burning her skin. blisters   Codeine Nausea And Vomiting    Cough syrup     Medications Prior to Admission  Medication Sig Dispense Refill   aspirin EC 81 MG tablet Take 81 mg by mouth at bedtime.     atorvastatin (LIPITOR) 40 MG tablet Take 40 mg by mouth at bedtime.     Cholecalciferol (VITAMIN D3) 5000 units CAPS Take 5,000 Units by mouth at bedtime.     CINNAMON PO 1000mg  one p.o q day     ezetimibe (ZETIA) 10 MG tablet Take 10 mg by mouth daily.     hydroxychloroquine (PLAQUENIL) 200 MG tablet      levothyroxine (SYNTHROID, LEVOTHROID) 50 MCG tablet Take 50 mcg by mouth every other day. Alternate with 75mcg every other day     levothyroxine (SYNTHROID, LEVOTHROID) 75 MCG tablet Take 75 mcg by mouth every other day. Alternate with 50mcg every other day     lisinopril (PRINIVIL,ZESTRIL) 20 MG tablet Take 10 mg by mouth daily as needed (if for high blood pressure).     MAGNESIUM CITRATE PO 500mg  q day  Omega-3 Fatty Acids (FISH OIL PO) Take 5,000 mg by mouth at bedtime. Soft gel     omeprazole (PRILOSEC) 20 MG capsule Take 20 mg by mouth daily.     vitamin C (ASCORBIC ACID) 250 MG tablet Take 250 mg by mouth every other day. gummy     Cholecalciferol (VITAMIN D-3 PO) 5000 one p.o q day      No results found for this or any previous visit (from the past 48 hours). No results found.  Review of Systems  Blood pressure (!) 154/60, pulse 69, temperature 98.1 F (36.7 C),  temperature source Oral, resp. rate 16, height 5' 1 (1.549 m), weight 74.8 kg, SpO2 98%. Physical Exam  GENERAL: The patient is AO x3, in no acute distress. HEENT: Head is normocephalic and atraumatic. EOMI are intact. Mouth is well hydrated and without lesions. NECK: Supple. No masses LUNGS: Clear to auscultation. No presence of rhonchi/wheezing/rales. Adequate chest expansion HEART: RRR, normal s1 and s2. ABDOMEN: Soft, nontender, no guarding, no peritoneal signs, and nondistended. BS +. No masses. EXTREMITIES: Without any cyanosis, clubbing, rash, lesions or edema. NEUROLOGIC: AOx3, no focal motor deficit. SKIN: no jaundice, no rashes  Assessment/Plan 73 y/o with PMH GERD c/b BE, DM, HTN, HLD, hyperthyroidism, OSA, coming for BE surveillance. Will proceed with EGD.  Toribio Eartha Flavors, MD 03/21/2024, 1:33 PM

## 2024-03-21 NOTE — Anesthesia Preprocedure Evaluation (Signed)
 Anesthesia Evaluation  Patient identified by MRN, date of birth, ID band Patient awake    Reviewed: Allergy & Precautions, H&P , NPO status , Patient's Chart, lab work & pertinent test results, reviewed documented beta blocker date and time   History of Anesthesia Complications (+) PONV and history of anesthetic complications  Airway Mallampati: II  TM Distance: >3 FB Neck ROM: full    Dental no notable dental hx.    Pulmonary neg pulmonary ROS, sleep apnea    Pulmonary exam normal breath sounds clear to auscultation       Cardiovascular Exercise Tolerance: Good hypertension, negative cardio ROS  Rhythm:regular Rate:Normal     Neuro/Psych negative neurological ROS  negative psych ROS   GI/Hepatic negative GI ROS, Neg liver ROS,GERD  ,,  Endo/Other  negative endocrine ROSdiabetes Hyperthyroidism   Renal/GU negative Renal ROS  negative genitourinary   Musculoskeletal   Abdominal   Peds  Hematology negative hematology ROS (+)   Anesthesia Other Findings   Reproductive/Obstetrics negative OB ROS                              Anesthesia Physical Anesthesia Plan  ASA: 2  Anesthesia Plan: General   Post-op Pain Management:    Induction:   PONV Risk Score and Plan: Propofol  infusion  Airway Management Planned:   Additional Equipment:   Intra-op Plan:   Post-operative Plan:   Informed Consent: I have reviewed the patients History and Physical, chart, labs and discussed the procedure including the risks, benefits and alternatives for the proposed anesthesia with the patient or authorized representative who has indicated his/her understanding and acceptance.     Dental Advisory Given  Plan Discussed with: CRNA  Anesthesia Plan Comments:         Anesthesia Quick Evaluation

## 2024-03-23 ENCOUNTER — Encounter (HOSPITAL_COMMUNITY): Payer: Self-pay | Admitting: Gastroenterology

## 2024-03-23 ENCOUNTER — Ambulatory Visit (INDEPENDENT_AMBULATORY_CARE_PROVIDER_SITE_OTHER): Payer: Self-pay | Admitting: Gastroenterology

## 2024-03-23 LAB — SURGICAL PATHOLOGY

## 2024-03-23 NOTE — Anesthesia Postprocedure Evaluation (Signed)
 Anesthesia Post Note  Patient: Renee Acevedo  Procedure(s) Performed: EGD (ESOPHAGOGASTRODUODENOSCOPY)  Patient location during evaluation: Phase II Anesthesia Type: General Level of consciousness: awake Pain management: pain level controlled Vital Signs Assessment: post-procedure vital signs reviewed and stable Respiratory status: spontaneous breathing and respiratory function stable Cardiovascular status: blood pressure returned to baseline and stable Postop Assessment: no headache and no apparent nausea or vomiting Anesthetic complications: no Comments: Late entry   No notable events documented.   Last Vitals:  Vitals:   03/21/24 1309 03/21/24 1409  BP: (!) 154/60 (!) 115/48  Pulse: 69 62  Resp: 16 20  Temp: 36.7 C 36.6 C  SpO2: 98% 97%    Last Pain:  Vitals:   03/21/24 1409  TempSrc: Oral  PainSc: 0-No pain                 Yvonna JINNY Bosworth

## 2024-03-27 NOTE — Progress Notes (Signed)
 3 yr EGD noted in recall Patient result letter mailed procedure note and pathology result faxed to PCP
